# Patient Record
Sex: Female | Born: 1975
Health system: Southern US, Community
[De-identification: ages and names within clinical notes are randomized; demographics above are authoritative.]

## PROBLEM LIST (undated history)

## (undated) DIAGNOSIS — J45909 Unspecified asthma, uncomplicated: Secondary | ICD-10-CM

## (undated) HISTORY — PX: NO PAST SURGERIES: SHX2092

## (undated) HISTORY — DX: Unspecified asthma, uncomplicated: J45.909

---

## 2004-03-25 ENCOUNTER — Emergency Department (HOSPITAL_COMMUNITY): Admission: EM | Admit: 2004-03-25 | Discharge: 2004-03-25 | Payer: Self-pay | Admitting: Emergency Medicine

## 2004-04-06 ENCOUNTER — Ambulatory Visit: Payer: Self-pay | Admitting: Family Medicine

## 2004-05-26 ENCOUNTER — Ambulatory Visit: Payer: Self-pay | Admitting: Family Medicine

## 2004-07-29 ENCOUNTER — Ambulatory Visit: Payer: Self-pay | Admitting: Family Medicine

## 2004-09-07 ENCOUNTER — Ambulatory Visit: Payer: Self-pay | Admitting: *Deleted

## 2004-09-07 ENCOUNTER — Ambulatory Visit: Payer: Self-pay | Admitting: Family Medicine

## 2004-09-21 ENCOUNTER — Ambulatory Visit: Payer: Self-pay | Admitting: Family Medicine

## 2004-10-19 ENCOUNTER — Ambulatory Visit: Payer: Self-pay | Admitting: Family Medicine

## 2005-03-02 ENCOUNTER — Ambulatory Visit: Payer: Self-pay | Admitting: Nurse Practitioner

## 2005-04-06 ENCOUNTER — Ambulatory Visit: Payer: Self-pay | Admitting: Family Medicine

## 2005-07-05 ENCOUNTER — Ambulatory Visit: Payer: Self-pay | Admitting: Family Medicine

## 2006-04-26 ENCOUNTER — Ambulatory Visit: Payer: Self-pay | Admitting: Family Medicine

## 2006-07-26 ENCOUNTER — Encounter (INDEPENDENT_AMBULATORY_CARE_PROVIDER_SITE_OTHER): Payer: Self-pay | Admitting: Family Medicine

## 2006-07-26 ENCOUNTER — Ambulatory Visit: Payer: Self-pay | Admitting: Family Medicine

## 2007-01-17 ENCOUNTER — Ambulatory Visit: Payer: Self-pay | Admitting: Internal Medicine

## 2007-04-03 ENCOUNTER — Encounter (INDEPENDENT_AMBULATORY_CARE_PROVIDER_SITE_OTHER): Payer: Self-pay | Admitting: *Deleted

## 2007-06-06 ENCOUNTER — Ambulatory Visit: Payer: Self-pay | Admitting: Internal Medicine

## 2007-08-14 ENCOUNTER — Ambulatory Visit: Payer: Self-pay | Admitting: Family Medicine

## 2007-08-14 ENCOUNTER — Encounter: Payer: Self-pay | Admitting: Family Medicine

## 2007-08-14 ENCOUNTER — Encounter (INDEPENDENT_AMBULATORY_CARE_PROVIDER_SITE_OTHER): Payer: Self-pay | Admitting: Family Medicine

## 2007-08-14 LAB — CONVERTED CEMR LAB
Chlamydia, DNA Probe: NEGATIVE
GC Probe Amp, Genital: NEGATIVE

## 2007-08-22 ENCOUNTER — Ambulatory Visit: Payer: Self-pay | Admitting: Family Medicine

## 2007-08-22 ENCOUNTER — Encounter (INDEPENDENT_AMBULATORY_CARE_PROVIDER_SITE_OTHER): Payer: Self-pay | Admitting: Internal Medicine

## 2007-08-22 LAB — CONVERTED CEMR LAB
ALT: 17 units/L (ref 0–35)
Alkaline Phosphatase: 69 units/L (ref 39–117)
BUN: 12 mg/dL (ref 6–23)
Basophils Absolute: 0 10*3/uL (ref 0.0–0.1)
Calcium: 8.9 mg/dL (ref 8.4–10.5)
Creatinine, Ser: 0.69 mg/dL (ref 0.40–1.20)
HCT: 43.2 % (ref 36.0–46.0)
LDL Cholesterol: 90 mg/dL (ref 0–99)
Lymphs Abs: 2.7 10*3/uL (ref 0.7–4.0)
MCHC: 32.4 g/dL (ref 30.0–36.0)
MCV: 87.6 fL (ref 78.0–100.0)
Monocytes Relative: 7 % (ref 3–12)
Neutro Abs: 4.8 10*3/uL (ref 1.7–7.7)
Neutrophils Relative %: 58 % (ref 43–77)
Platelets: 277 10*3/uL (ref 150–400)
Potassium: 4.6 meq/L (ref 3.5–5.3)
RBC: 4.93 M/uL (ref 3.87–5.11)
RDW: 14.2 % (ref 11.5–15.5)
TSH: 0.666 microintl units/mL (ref 0.350–5.50)
Total Bilirubin: 0.6 mg/dL (ref 0.3–1.2)
Triglycerides: 103 mg/dL (ref <150)
VLDL: 21 mg/dL (ref 0–40)

## 2007-10-02 ENCOUNTER — Ambulatory Visit: Payer: Self-pay | Admitting: Family Medicine

## 2008-01-29 ENCOUNTER — Ambulatory Visit: Payer: Self-pay | Admitting: Internal Medicine

## 2008-05-06 ENCOUNTER — Ambulatory Visit: Payer: Self-pay | Admitting: Family Medicine

## 2009-03-24 ENCOUNTER — Ambulatory Visit: Payer: Self-pay | Admitting: Internal Medicine

## 2009-05-13 ENCOUNTER — Ambulatory Visit: Payer: Self-pay | Admitting: Internal Medicine

## 2010-02-16 ENCOUNTER — Ambulatory Visit: Payer: Self-pay | Admitting: Internal Medicine

## 2012-09-11 ENCOUNTER — Encounter (HOSPITAL_COMMUNITY): Payer: Self-pay

## 2012-09-11 ENCOUNTER — Emergency Department (HOSPITAL_COMMUNITY)
Admission: EM | Admit: 2012-09-11 | Discharge: 2012-09-11 | Disposition: A | Discharge status: Home / Self Care | Payer: No Typology Code available for payment source | Source: Home / Self Care

## 2012-09-11 MED ORDER — ALBUTEROL SULFATE HFA 108 (90 BASE) MCG/ACT IN AERS: 2.0000 | INHALATION_SPRAY | RESPIRATORY_TRACT | Status: DC | PRN

## 2012-09-11 MED ORDER — FLUTICASONE-SALMETEROL 250-50 MCG/DOSE IN AEPB: 1.0000 | INHALATION_SPRAY | Freq: Two times a day (BID) | RESPIRATORY_TRACT | Status: DC

## 2012-09-11 NOTE — ED Provider Notes (Signed)
Patient Demographics  Michele Wright, is a 37 y.o. female  ZOX:[id number]  [id number]  DOB - 09-19-1975  Chief Complaint  Patient presents with  . Asthma        Subjective:   Michele Wright (non-English speaking)Spanish speaking female , and I spoke to her through an interpreter by phone.today she has, No headache, No chest pain, No abdominal pain - No Nausea,  No Cough - SOB. She reports that she has had numbness in both hands that is worse after work. 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 She has a history of asthma and is requesting a refill of her medications also asking where she can get her medications at the cheapest cost.  Objective:    Filed Vitals:   09/11/12 1022  BP: 116/62  Pulse: 53  Temp: 98 F (36.7 C)  TempSrc: Oral  SpO2: 100%     Exam  Awake Alert, Oriented X 3, No new F.N deficits, Normal affect [place].AT,PERRAL Supple Neck,No JVD, No cervical lymphadenopathy appriciated.  Symmetrical Chest wall movement, Good air movement bilaterally, CTAB RRR,No Gallops,Rubs or new Murmurs, No Parasternal Heave +ve B.Sounds, Abd Soft, Non tender, No organomegaly appriciated, No rebound - guarding or rigidity. Extremities/neuro:No Cyanosis, Clubbing or edema, No new Rash or bruise. The strength in her hands is 5 out of 5, sensory is grossly intact, nonfocal.    Data Review   CBC No results found for this basename: WBC, HGB, HCT, PLT, MCV, MCH, MCHC, RDW, NEUTRABS, LYMPHSABS, MONOABS, EOSABS, BASOSABS, BANDABS, BANDSABD,  in the last 168 hours  Chemistries   No results found for this basename: NA, K, CL, CO2, GLUCOSE, BUN, CREATININE, GFRCGP, CALCIUM, MG, AST, ALT, ALKPHOS, BILITOT,  in the last 168 hours ------------------------------------------------------------------------------------------------------------------ No results found for this basename: HGBA1C,  in the last 72  hours ------------------------------------------------------------------------------------------------------------------ No results found for this basename: CHOL, HDL, LDLCALC, TRIG, CHOLHDL, LDLDIRECT,  in the last 72 hours ------------------------------------------------------------------------------------------------------------------ No results found for this basename: TSH, T4TOTAL, FREET3, T3FREE, THYROIDAB,  in the last 72 hours ------------------------------------------------------------------------------------------------------------------ No results found for this basename: VITAMINB12, FOLATE, FERRITIN, TIBC, IRON, RETICCTPCT,  in the last 72 hours  Coagulation profile  No results found for this basename: INR, PROTIME,  in the last 168 hours     Prior to Admission medications   Medication Sig Start Date End Date Taking? Authorizing Provider  Albuterol (PROVENTIL IN) Inhale into the lungs.   Yes Historical Provider, MD  Fluticasone-Salmeterol (ADVAIR) 250-50 MCG/DOSE AEPB Inhale 1 puff into the lungs every 12 (twelve) hours.   Yes Historical Provider, MD  albuterol (PROVENTIL HFA;VENTOLIN HFA) 108 (90 BASE) MCG/ACT inhaler Inhale 2 puffs into the lungs every 4 (four) hours as needed for wheezing. 09/11/12   Kela Millin, MD  Fluticasone-Salmeterol (ADVAIR DISKUS) 250-50 MCG/DOSE AEPB Inhale 1 puff into the lungs 2 (two) times daily. 09/11/12   Kela Millin, MD     Assessment & Plan   1.Asthma -Stable, Will refill her medications -Advair and when necessary albuterol. 2. Bilateral hand numbness-probable carpal tunnel syndrome -I have recommended bilateral wrist splints  Follow-up Information   Follow up In 3 months. (or as needed)        Aleksei Goodlin C M.D on 09/11/2012 at 10:57 AM   Kela Millin, MD 09/11/12 1105

## 2012-09-11 NOTE — ED Notes (Signed)
Patient has history of asthma Needs medication refill

## 2012-09-11 NOTE — Discharge Instructions (Signed)
Bilateral wrist splints at daily at bed time , and ibuprofen as needed only

## 2013-04-03 ENCOUNTER — Ambulatory Visit: Payer: No Typology Code available for payment source | Attending: Family Medicine | Admitting: Internal Medicine

## 2013-04-03 VITALS — BP 134/86 | HR 82 | Temp 98.2°F | Resp 16 | Wt 172.0 lb

## 2013-04-03 DIAGNOSIS — Z0000 Encounter for general adult medical examination without abnormal findings: Secondary | ICD-10-CM | POA: Insufficient documentation

## 2013-04-03 DIAGNOSIS — Z23 Encounter for immunization: Secondary | ICD-10-CM

## 2013-04-03 DIAGNOSIS — J45909 Unspecified asthma, uncomplicated: Secondary | ICD-10-CM | POA: Insufficient documentation

## 2013-04-03 LAB — CBC WITH DIFFERENTIAL/PLATELET
Eosinophils Relative: 3 % (ref 0–5)
Hemoglobin: 14.6 g/dL (ref 12.0–15.0)
Lymphocytes Relative: 32 % (ref 12–46)
MCV: 82.9 fL (ref 78.0–100.0)
Monocytes Absolute: 0.6 10*3/uL (ref 0.1–1.0)
Neutro Abs: 5.1 10*3/uL (ref 1.7–7.7)
RBC: 5.2 MIL/uL — ABNORMAL HIGH (ref 3.87–5.11)
RDW: 13.8 % (ref 11.5–15.5)

## 2013-04-03 LAB — LIPID PANEL
HDL: 52 mg/dL (ref >39)
LDL Cholesterol: 99 mg/dL (ref 0–99)
Triglycerides: 170 mg/dL — ABNORMAL HIGH (ref <150)

## 2013-04-03 LAB — BASIC METABOLIC PANEL
BUN: 17 mg/dL (ref 6–23)
CO2: 23 mEq/L (ref 19–32)
Creat: 0.64 mg/dL (ref 0.50–1.10)

## 2013-04-03 MED ORDER — FLUTICASONE-SALMETEROL 250-50 MCG/DOSE IN AEPB: 1.0000 | INHALATION_SPRAY | Freq: Two times a day (BID) | RESPIRATORY_TRACT | Status: DC

## 2013-04-03 MED ORDER — ALBUTEROL SULFATE HFA 108 (90 BASE) MCG/ACT IN AERS: 2.0000 | INHALATION_SPRAY | RESPIRATORY_TRACT | Status: DC | PRN

## 2013-04-03 NOTE — Progress Notes (Signed)
Patient here for medication refill Need advair and inhaler

## 2013-04-03 NOTE — Progress Notes (Signed)
Patient ID: Michele Wright, female   DOB: [birth year], 37 y.o.   MRN: [id number] Patient Demographics  Michele Wright, is a 37 y.o. female  CSN:[id number]  [id number]:[id number]  DOB - 1976/01/24  Chief Complaint  Patient presents with  . Medication Refill        Subjective:   Michele Wright today is here to establish primary care. Patient has No headache, No chest pain, No abdominal pain - No Nausea,  No Cough - SOB. Wants refill for her asthma medication.  Also states that she works in a [employer], at the end of the day her fingers of both hands feel numb and tingling  Objective:    Filed Vitals:   04/03/13 1032  BP: 134/86  Pulse: 82  Temp: 98.2 F (36.8 C)  Resp: 16  Weight: 172 lb (78.019 kg)  SpO2: 98%     ALLERGIES:  No Known Allergies  PAST MEDICAL HISTORY: History reviewed. No pertinent past medical history.  PAST SURGICAL HISTORY: History reviewed. No pertinent past surgical history.  FAMILY HISTORY: History reviewed. No pertinent family history.  MEDICATIONS AT HOME: Prior to Admission medications   Medication Sig Start Date End Date Taking? Authorizing Provider  albuterol (PROVENTIL HFA;VENTOLIN HFA) 108 (90 BASE) MCG/ACT inhaler Inhale 2 puffs into the lungs every 4 (four) hours as needed for wheezing. 04/03/13   Shaquia Berkley Jenna Luo, MD  Fluticasone-Salmeterol (ADVAIR DISKUS) 250-50 MCG/DOSE AEPB Inhale 1 puff into the lungs 2 (two) times daily. 09/11/12   Kela Millin, MD  Fluticasone-Salmeterol (ADVAIR) 250-50 MCG/DOSE AEPB Inhale 1 puff into the lungs 2 (two) times daily. 04/03/13   Molly Maselli Jenna Luo, MD    REVIEW OF SYSTEMS:  Constitutional:   No   Fevers, chills, fatigue.  HEENT:    No headaches, Sore throat,   Cardio-vascular: No chest pain,  Orthopnea, swelling in lower extremities, anasarca, palpitations  GI:  No abdominal pain, nausea, vomiting, diarrhea  Resp: No shortness of breath,  No coughing up of blood.No  cough.No wheezing.  Skin:  no rash or lesions.  GU:  no dysuria, change in color of urine, no urgency or frequency.  No flank pain.  Musculoskeletal: No joint pain or swelling.  No decreased range of motion.  No back pain.  Psych: No change in mood or affect. No depression or anxiety.  No memory loss.   Exam  General appearance :Awake, alert, NAD, Speech Clear. HEENT: Atraumatic and Normocephalic, PERLA Neck: supple, no JVD. No cervical lymphadenopathy.  Chest: clear to auscultation bilaterally, no wheezing, rales or rhonchi CVS: S1 S2 regular, no murmurs.  Abdomen: soft, NBS, NT, ND, no gaurding, rigidity or rebound. Extremities: No cyanosis, clubbing, B/L Lower Ext shows no edema,  Neurology: Awake alert, and oriented X 3, CN II-XII intact, Non focal Skin:No Rash or lesions Wounds: N/A    Data Review   Basic Metabolic Panel: No results found for this basename: NA, K, CL, CO2, GLUCOSE, BUN, CREATININE, CALCIUM, MG, PHOS,  in the last 168 hours Liver Function Tests: No results found for this basename: AST, ALT, ALKPHOS, BILITOT, PROT, ALBUMIN,  in the last 168 hours  CBC: No results found for this basename: WBC, NEUTROABS, HGB, HCT, MCV, PLT,  in the last 168 hours ------------------------------------------------------------------------------------------------------------------ No results found for this basename: HGBA1C,  in the last 72 hours ------------------------------------------------------------------------------------------------------------------ No results found for this basename: CHOL, HDL, LDLCALC, TRIG, CHOLHDL, LDLDIRECT,  in the last 72 hours ------------------------------------------------------------------------------------------------------------------ No results found for this basename: TSH,  T4TOTAL, FREET3, T3FREE, THYROIDAB,  in the last 72  hours ------------------------------------------------------------------------------------------------------------------ No results found for this basename: VITAMINB12, FOLATE, FERRITIN, TIBC, IRON, RETICCTPCT,  in the last 72 hours  Coagulation profile  No results found for this basename: INR, PROTIME,  in the last 168 hours    Assessment & Plan   Active Problems: Chronic asthma: - Currently stable, no wheezing. 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 Denies any pain. Difficult to change job assignment. - If no significant improvement in couple of months, may need neurosurgery referral  General health screening - Patient states she gets routine Pap smears every year and she takes care of it by herself with OB/GYN as she has an IUD - Will order flu vaccine - Ordered CBC, BMET, lipid panel  Follow-up in one month for the labs and to assess improvement in the numbness and tingling in fingers   Kahli Mayon M.D. 04/03/2013, 10:48 AM

## 2013-05-05 ENCOUNTER — Ambulatory Visit: Payer: No Typology Code available for payment source

## 2013-05-08 ENCOUNTER — Ambulatory Visit: Payer: No Typology Code available for payment source | Attending: Internal Medicine | Admitting: Internal Medicine

## 2013-05-08 VITALS — BP 124/78 | HR 76 | Temp 98.2°F | Resp 17 | Wt 171.2 lb

## 2013-05-08 DIAGNOSIS — Z09 Encounter for follow-up examination after completed treatment for conditions other than malignant neoplasm: Secondary | ICD-10-CM

## 2013-05-08 DIAGNOSIS — J45909 Unspecified asthma, uncomplicated: Secondary | ICD-10-CM | POA: Insufficient documentation

## 2013-05-08 NOTE — Progress Notes (Signed)
Patient here with interpreter States here for follow up on her asthma and for blood work

## 2013-05-08 NOTE — Patient Instructions (Signed)
Síndrome del túnel carpiano   (Carpal Tunnel Syndrome)   El túnel carpiano es un espacio estrecho ubicado en el lado palmar de la muñeca. Está formado por los huesos de la muñeca y los ligamentos. Los nervios, vasos sanguíneos y tendones pasan a través del túnel carpiano. Los movimientos de la muñeca o ciertas enfermedades pueden causar hinchazón del túnel. Esta hinchazón comprime el nervio principal en la muñeca ((nervio mediano) y ocasiona un trastorno doloroso que se denomina síndrome del túnel carpiano.  CAUSAS   · Movimientos repetidos de la muñeca.  · Lesiones en la muñeca.  · Ciertas enfermedades como la artritis, la diabetes, el alcoholismo, el hipertiroidismo o la insuficiencia renal.  · Obesidad.  · Embarazo.  SÍNTOMAS   · Sensación de "pinchazos" en los dedos o la mano.  · Hormigueo o entumecimiento en los dedos o en la mano.  · Sensación dolorosa en todo el brazo.  · Dolor en la muñeca que sube por el brazo hasta el hombro.  · Dolor que baja por la mano o los dedos.  · Sensación de debilidad en las manos.  DIAGNÓSTICO   El médico le hará una historia clínica y un examen físico. Puede ser necesario hacer un electromiograma. Esta prueba mide las señales eléctricas enviadas por los músculos. Generalmente el paso de las señales eléctricas es impedido por el síndrome del túnel carpiano. También puede ser necesario que le tomen radiografías.   TRATAMIENTO   El síndrome del túnel carpiano puede curarse espontáneamente sin tratamiento. El médico le indicará el uso de un cabestrillo para la muñeca o que tome medicamentos como antiinflamatorios no esteroides. Las inyecciones de cortisona pueden ayudar. A veces es necesaria la cirugía para liberar el nervio comprimido.   INSTRUCCIONES PARA EL CUIDADO EN EL HOGAR   · Tome todos los medicamentos según le indicó su médico. Sólo tome medicamentos de venta libre o recetados para calmar el dolor, las molestias o bajar la fiebre según las indicaciones de su médico.  · Si  le aconsejaron usar un cabestrillo para evitar que la muñeca se doble, úselo como le indicaron. Es importante que use el cabestrillo durante la noche. Úselo mientras sienta dolor o adormecimiento en la mano, el brazo o la muñeca. Esto puede durar entre 1 y 2 meses.  · Haga reposar la muñeca de toda actividad que le cause dolor. Si sus síntomas están relacionados con el trabajo, deberá conversar con su empleador acerca de la posibilidad de cambiar a una tarea que no requiera el uso de la muñeca.  · Aplique hielo en la muñeca después de los períodos prolongados de actividad.  · Ponga el hielo en una bolsa plástica.  · Colóquese una toalla entre la piel y la bolsa de hielo.  · Deje el hielo en el lugar durante 15 a 20 minutos, 3 a 4 veces por día.  · Cumpla con todas las visitas de control, según le indique su médico. Aquí se incluyen derivaciones a un ortopedista, fisioterapia y rehabilitación. Toda demora en obtener la asistencia necesaria puede dar como resultado una demora o fracaso en la curación.  SOLICITE ATENCIÓN MÉDICA DE INMEDIATO SI:   · Desarrolla nuevos e inexplicables síntomas.  · Los síntomas actuales empeoran y la medicación no los controla.  ASEGÚRESE DE QUE:   · Comprende estas instrucciones.  · Controlará su enfermedad.  · Solicitará ayuda de inmediato si no mejora o si empeora.  Document Released: 07/03/2005 Document Revised: 03/27/2012  ExitCare® Patient Information ©2014 [employer]   LL[employer].

## 2013-05-08 NOTE — Progress Notes (Signed)
Patient ID: Michele Wright, female   DOB: 1976/04/09, 37 y.o.   MRN: [id number]   CC:  Followup  HPI:  Patient is 37 year old female who presents to clinic for followup on blood tests. She reports feeling well, denies any specific concerns such as chest pain or shortness of breath. She has had recent asthma flareup but explained she's been taking her Advair and albuterol as needed and her symptoms are now controlled well. She denies fevers and chills, no cough, no sick contacts or exposures. She does not need refills on medicines.  No Known Allergies Past Medical History  Diagnosis Date  . Asthma    Current Outpatient Prescriptions on File Prior to Visit  Medication Sig Dispense Refill  . albuterol (PROVENTIL HFA;VENTOLIN HFA) 108 (90 BASE) MCG/ACT inhaler Inhale 2 puffs into the lungs every 4 (four) hours as needed for wheezing.  1 Inhaler  5  . Fluticasone-Salmeterol (ADVAIR DISKUS) 250-50 MCG/DOSE AEPB Inhale 1 puff into the lungs 2 (two) times daily.  60 each  5  . Fluticasone-Salmeterol (ADVAIR) 250-50 MCG/DOSE AEPB Inhale 1 puff into the lungs 2 (two) times daily.  60 each  5   No current facility-administered medications on file prior to visit.   No known family medical history History   Social History  . Marital Status: Single    Spouse Name: N/A    Number of Children: N/A  . Years of Education: N/A   Occupational History  . Not on file.   Social History Main Topics  . Smoking status: Never Smoker   . Smokeless tobacco: Not on file  . Alcohol Use: Yes     Comment: occasionally  . Drug Use: No  . Sexual Activity: Not on file   Other Topics Concern  . Not on file   Social History Narrative  . No narrative on file    Review of Systems  Constitutional: Negative for fever, chills, diaphoresis, activity change, appetite change and fatigue.  HENT: Negative for ear pain, nosebleeds, congestion, facial swelling, rhinorrhea, neck pain, neck stiffness and ear  discharge.   Eyes: Negative for pain, discharge, redness, itching and visual disturbance.  Respiratory: Negative for cough, choking, chest tightness, shortness of breath, wheezing and stridor.   Cardiovascular: Negative for chest pain, palpitations and leg swelling.  Gastrointestinal: Negative for abdominal distention.  Genitourinary: Negative for dysuria, urgency, frequency, hematuria, flank pain, decreased urine volume, difficulty urinating and dyspareunia.  Musculoskeletal: Negative for back pain, joint swelling, arthralgias and gait problem.  Neurological: Negative for dizziness, tremors, seizures, syncope, facial asymmetry, speech difficulty, weakness, light-headedness, numbness and headaches.  Hematological: Negative for adenopathy. Does not bruise/bleed easily.  Psychiatric/Behavioral: Negative for hallucinations, behavioral problems, confusion, dysphoric mood, decreased concentration and agitation.    Objective:   Filed Vitals:   05/08/13 1019  BP: 124/78  Pulse: 76  Temp: 98.2 F (36.8 C)  Resp: 17    Physical Exam  Constitutional: Appears well-developed and well-nourished. No distress.  CVS: RRR, S1/S2 +, no murmurs, no gallops, no carotid bruit.  Pulmonary: Effort and breath sounds normal, no stridor, rhonchi, wheezes, rales.  Abdominal: Soft. BS +,  no distension, tenderness, rebound or guarding.   Lab Results  Component Value Date   WBC 8.7 04/03/2013   HGB 14.6 04/03/2013   HCT 43.1 04/03/2013   MCV 82.9 04/03/2013   PLT 251 04/03/2013   Lab Results  Component Value Date   CREATININE 0.64 04/03/2013   BUN 17 04/03/2013   NA 135  04/03/2013   K 4.2 04/03/2013   CL 104 04/03/2013   CO2 23 04/03/2013    No results found for this basename: HGBA1C   Lipid Panel     Component Value Date/Time   CHOL 185 04/03/2013 1049   TRIG 170* 04/03/2013 1049   HDL 52 04/03/2013 1049   CHOLHDL 3.6 04/03/2013 1049   VLDL 34 04/03/2013 1049   LDLCALC 99 04/03/2013 1049        Assessment and plan:   Patient Active Problem List   Diagnosis Date Noted  . Asthma, chronic - stable, maintaining oxygen saturations at target range. No wheezing on exam noted today. I have advised avoiding possible triggers such as carpets, dogs or cats. Patient denies having carpets, no pets at home. I've also advised continuing compliance with current medicines.  04/03/2013

## 2013-06-27 ENCOUNTER — Other Ambulatory Visit: Payer: Self-pay | Admitting: Internal Medicine

## 2013-06-27 MED ORDER — ALBUTEROL SULFATE HFA 108 (90 BASE) MCG/ACT IN AERS: 2.0000 | INHALATION_SPRAY | RESPIRATORY_TRACT | Status: DC | PRN

## 2013-06-27 MED ORDER — FLUTICASONE-SALMETEROL 250-50 MCG/DOSE IN AEPB: 1.0000 | INHALATION_SPRAY | Freq: Two times a day (BID) | RESPIRATORY_TRACT | Status: DC

## 2013-09-17 ENCOUNTER — Other Ambulatory Visit (HOSPITAL_COMMUNITY): Payer: Self-pay | Admitting: Nurse Practitioner

## 2013-09-17 DIAGNOSIS — Z1231 Encounter for screening mammogram for malignant neoplasm of breast: Secondary | ICD-10-CM

## 2013-10-01 ENCOUNTER — Ambulatory Visit (HOSPITAL_COMMUNITY)
Admission: RE | Admit: 2013-10-01 | Discharge: 2013-10-01 | Disposition: A | Discharge status: Home / Self Care | Payer: No Typology Code available for payment source | Source: Ambulatory Visit | Attending: Nurse Practitioner | Admitting: Nurse Practitioner

## 2013-10-01 DIAGNOSIS — Z1231 Encounter for screening mammogram for malignant neoplasm of breast: Secondary | ICD-10-CM

## 2013-10-06 ENCOUNTER — Other Ambulatory Visit: Payer: Self-pay | Admitting: Nurse Practitioner

## 2013-10-06 DIAGNOSIS — R928 Other abnormal and inconclusive findings on diagnostic imaging of breast: Secondary | ICD-10-CM

## 2013-10-15 ENCOUNTER — Ambulatory Visit
Admission: RE | Admit: 2013-10-15 | Discharge: 2013-10-15 | Disposition: A | Discharge status: Home / Self Care | Payer: No Typology Code available for payment source | Source: Ambulatory Visit | Attending: Nurse Practitioner | Admitting: Nurse Practitioner

## 2013-10-15 ENCOUNTER — Other Ambulatory Visit: Payer: Self-pay | Admitting: Nurse Practitioner

## 2013-10-15 DIAGNOSIS — R928 Other abnormal and inconclusive findings on diagnostic imaging of breast: Secondary | ICD-10-CM

## 2013-10-15 DIAGNOSIS — N6320 Unspecified lump in the left breast, unspecified quadrant: Secondary | ICD-10-CM

## 2013-10-22 ENCOUNTER — Other Ambulatory Visit: Payer: Self-pay

## 2014-01-07 ENCOUNTER — Ambulatory Visit: Payer: Self-pay | Admitting: Internal Medicine

## 2014-01-14 ENCOUNTER — Encounter: Payer: Self-pay | Admitting: Internal Medicine

## 2014-01-14 ENCOUNTER — Ambulatory Visit: Payer: No Typology Code available for payment source | Attending: Internal Medicine | Admitting: Internal Medicine

## 2014-01-14 VITALS — BP 124/78 | HR 97 | Temp 98.7°F | Wt 177.0 lb

## 2014-01-14 DIAGNOSIS — M79609 Pain in unspecified limb: Secondary | ICD-10-CM | POA: Insufficient documentation

## 2014-01-14 DIAGNOSIS — J45909 Unspecified asthma, uncomplicated: Secondary | ICD-10-CM | POA: Insufficient documentation

## 2014-01-14 DIAGNOSIS — M79672 Pain in left foot: Secondary | ICD-10-CM

## 2014-01-14 DIAGNOSIS — M79671 Pain in right foot: Secondary | ICD-10-CM | POA: Insufficient documentation

## 2014-01-14 MED ORDER — IBUPROFEN 600 MG PO TABS: 600.0000 mg | ORAL_TABLET | Freq: Three times a day (TID) | ORAL | Status: DC | PRN

## 2014-01-14 NOTE — Progress Notes (Signed)
MRN: [id number] Name: Michele Wright  Sex: female Age: 38 y.o. DOB: Mar 16, 1976  Allergies: Review of patient's allergies indicates no known allergies.  Chief Complaint  Patient presents with  . Asthma    follow up  . Foot Pain    both/heel    HPI: Patient is 38 y.o. female who has history of asthma comes today for followup her, denies any chest and shortness of breath she is on Advair and uses albuterol when necessary, denies smoking cigarettes, she reported to have bilateral heel pain which is more worse when she wakes up in the morning as well as after sitting for a while and when she starts walking then she feels more pain, she denies any fall or trauma denies numbness weakness.  Past Medical History  Diagnosis Date  . Asthma     No past surgical history on file.    Medication List       This list is accurate as of: 01/14/14  3:20 PM.  Always use your most recent med list.               albuterol 108 (90 BASE) MCG/ACT inhaler  Commonly known as:  PROVENTIL HFA;VENTOLIN HFA  Inhale 2 puffs into the lungs every 4 (four) hours as needed for wheezing.     Fluticasone-Salmeterol 250-50 MCG/DOSE Aepb  Commonly known as:  ADVAIR  Inhale 1 puff into the lungs 2 (two) times daily.     ibuprofen 600 MG tablet  Commonly known as:  ADVIL,MOTRIN  Take 1 tablet (600 mg total) by mouth every 8 (eight) hours as needed.        Meds ordered this encounter  Medications  . ibuprofen (ADVIL,MOTRIN) 600 MG tablet    Sig: Take 1 tablet (600 mg total) by mouth every 8 (eight) hours as needed.    Dispense:  30 tablet    Refill:  1    Immunization History  Administered Date(s) Administered  . Influenza Split 04/03/2013    No family history on file.  History  Substance Use Topics  . Smoking status: Never Smoker   . Smokeless tobacco: Not on file  . Alcohol Use: Yes     Comment: occasionally    Review of Systems   As noted in HPI  Filed Vitals:   01/14/14  1505  BP: 124/78  Pulse: 97  Temp: 98.7 F (37.1 C)    Physical Exam  Physical Exam  Constitutional: No distress.  Eyes: EOM are normal. Pupils are equal, round, and reactive to light.  Cardiovascular: Normal rate and regular rhythm.   Pulmonary/Chest: Breath sounds normal. No respiratory distress. She has no wheezes. She has no rales.  Musculoskeletal: She exhibits no edema.  Bilateral feet Tenderness with pressure on heels, 2+ dorsalis pedis pulse good range of motion at the ankle    CBC    Component Value Date/Time   WBC 8.7 04/03/2013 1049   RBC 5.20* 04/03/2013 1049   HGB 14.6 04/03/2013 1049   HCT 43.1 04/03/2013 1049   PLT 251 04/03/2013 1049   MCV 82.9 04/03/2013 1049   LYMPHSABS 2.8 04/03/2013 1049   MONOABS 0.6 04/03/2013 1049   EOSABS 0.2 04/03/2013 1049   BASOSABS 0.0 04/03/2013 1049    CMP     Component Value Date/Time   NA 135 04/03/2013 1049   K 4.2 04/03/2013 1049   CL 104 04/03/2013 1049   CO2 23 04/03/2013 1049   GLUCOSE 82 04/03/2013 1049  BUN 17 04/03/2013 1049   CREATININE 0.64 04/03/2013 1049   CREATININE 0.69 08/22/2007 2120   CALCIUM 9.0 04/03/2013 1049   PROT 7.4 08/22/2007 2120   ALBUMIN 4.2 08/22/2007 2120   AST 16 08/22/2007 2120   ALT 17 08/22/2007 2120   ALKPHOS 69 08/22/2007 2120   BILITOT 0.6 08/22/2007 2120    Lab Results  Component Value Date/Time   CHOL 185 04/03/2013 10:49 AM    No components found with this basename: hga1c    Lab Results  Component Value Date/Time   AST 16 08/22/2007  9:20 PM    Assessment and Plan  Unspecified asthma(493.90) Symptoms are well controlled continue with Advair and albuterol when necessary.  Heel pain, bilateral -   Plan: Likely plantar fasciitis, I have advised patient to wear comfortable shoes, given the handout to read, prescribed ibuprofen (ADVIL,MOTRIN) 600 MG tablet when necessary for pain    Return in about 3 months (around 04/16/2014) for asthma.  Doris CheadleADVANI, Adalia Pettis, MD

## 2014-01-14 NOTE — Progress Notes (Signed)
Patient in for a follow up for asthma also complains of severe heel pain on both feet.gp

## 2014-01-14 NOTE — Patient Instructions (Signed)
Fascitis plantar (Plantar Fascitis) La fascitis plantar es un trastorno frecuente que ocasiona dolor en el pie. Se trata de una inflamacin (irritacin) de la banda de tejidos fibrosos y resistentes que se extienden desde el hueso del taln (calcneo) hasta la parte anterior de la planta del pie. Esta inflamacin puede deberse a que ha permanecido demasiado tiempo de pie, a zapatos que no calzan bien, a correr sobre superficies duras, al sobrepeso, a un andar anormal y el uso excesivo del pie con dolor (esto es frecuente en los corredores). Tambin es frecuente entre los que practican ejercicios aerbicos y entre los bailarines de ballet. SNTOMAS La persona que sufre fascitis plantar manifiesta:  Dolor intenso por la maana en la parte inferior del pie, especialmente al dar los primeros pasos luego de levantarse de la cama. Este dolor disminuye luego de caminar algunos minutos.  Dolor intenso al caminar luego de un tiempo prolongado de inactividad.  El dolor empeora al caminar descalzo o al subir escaleras. DIAGNSTICO  El mdico har el diagnstico examinando sus pies.  En general no es necesario indicar radiografas. PREVENCIN  Consulte a un profesional especializado en medicina del deporte antes de comenzar un nuevo programa de ejercicios.  Los programas de caminatas ofrecen un buen entrenamiento. Hay una menor probabilidad de sufrir lesiones por uso excesivo, que son frecuentes entre las personas que corren. Hay menos impacto y menos agresin a las articulaciones.  Comience lentamente todo nuevo programa de ejercicios. Si aparece algn problema o dolor, disminuya la cantidad de tiempo o la distancia hasta que se encuentre cmodo.  Use calzado de buena calidad y reemplcelo regularmente.  Estire el pie y los ligamentos que se encuentran en la parte posterior del tobillo (tendn de [place]) antes y despus de realizar actividad fsica.  Corra o practique ejercicios sobre superficies  parejas que no sean duras. Por ejemplo, el asfalto es mejor que el pavimento.  No corra descalzo sobre superficies duras.  Si camina sobre cinta, vare la inclinacin.  No siga con el entrenamiento si tiene problemas en el pie o en la articulacin. Busque ayuda profesional si no mejora. INSTRUCCIONES PARA EL CUIDADO DOMICILIARIO  Evite las actividades que le causan dolor hasta que se recupere.  Use hielo o compresas fras sobre las zonas doloridas despus de realizar ejercicios.  Only take over-the-counter or prescription medicines for pain, discomfort, or fever as directed by your caregiver.  Las plantillas blandas o las zapatillas con base de aire o gel pueden ser de gran ayuda.  Si los problemas continan o se agravan, consulte a un especialista en medicina del deporte o con su mdico personal. La cortisona es un potente antiinflamatorio que puede inyectarse en la zona dolorida. El profesional que lo asiste comentar este tratamiento con usted. EST SEGURO QUE:   Comprende las instrucciones para el alta mdica.  Controlar su enfermedad.  Solicitar atencin mdica de inmediato segn las indicaciones. Document Released: 04/12/2005 Document Revised: 09/25/2011 Exit[employer] Patient Information 2015 [employer]. This information is not intended to replace advice given to you by your health care provider. Make sure you discuss any questions you have with your health care provider.  

## 2014-01-15 ENCOUNTER — Ambulatory Visit: Payer: No Typology Code available for payment source | Attending: Internal Medicine

## 2014-01-20 ENCOUNTER — Other Ambulatory Visit: Payer: Self-pay

## 2014-01-20 MED ORDER — FLUTICASONE-SALMETEROL 250-50 MCG/DOSE IN AEPB: 1.0000 | INHALATION_SPRAY | Freq: Two times a day (BID) | RESPIRATORY_TRACT | Status: DC

## 2014-01-20 MED ORDER — ALBUTEROL SULFATE HFA 108 (90 BASE) MCG/ACT IN AERS: 2.0000 | INHALATION_SPRAY | RESPIRATORY_TRACT | Status: DC | PRN

## 2014-02-27 ENCOUNTER — Ambulatory Visit: Payer: No Typology Code available for payment source | Attending: Internal Medicine

## 2014-03-30 ENCOUNTER — Ambulatory Visit: Payer: No Typology Code available for payment source

## 2014-04-08 ENCOUNTER — Ambulatory Visit: Payer: No Typology Code available for payment source

## 2014-04-16 ENCOUNTER — Ambulatory Visit
Admission: RE | Admit: 2014-04-16 | Discharge: 2014-04-16 | Disposition: A | Discharge status: Home / Self Care | Payer: No Typology Code available for payment source | Source: Ambulatory Visit | Attending: Nurse Practitioner | Admitting: Nurse Practitioner

## 2014-04-16 ENCOUNTER — Ambulatory Visit: Payer: Self-pay | Admitting: Internal Medicine

## 2014-04-16 ENCOUNTER — Other Ambulatory Visit: Payer: Self-pay

## 2014-04-16 DIAGNOSIS — N6320 Unspecified lump in the left breast, unspecified quadrant: Secondary | ICD-10-CM

## 2014-04-22 ENCOUNTER — Encounter: Payer: Self-pay | Admitting: Internal Medicine

## 2014-04-22 ENCOUNTER — Ambulatory Visit: Payer: No Typology Code available for payment source | Attending: Internal Medicine | Admitting: Internal Medicine

## 2014-04-22 VITALS — BP 120/73 | HR 65 | Temp 97.7°F | Resp 18 | Ht 64.0 in | Wt 178.0 lb

## 2014-04-22 DIAGNOSIS — Z13820 Encounter for screening for osteoporosis: Secondary | ICD-10-CM | POA: Insufficient documentation

## 2014-04-22 DIAGNOSIS — J45909 Unspecified asthma, uncomplicated: Secondary | ICD-10-CM | POA: Insufficient documentation

## 2014-04-22 DIAGNOSIS — M79671 Pain in right foot: Secondary | ICD-10-CM

## 2014-04-22 DIAGNOSIS — M79672 Pain in left foot: Secondary | ICD-10-CM

## 2014-04-22 DIAGNOSIS — J4520 Mild intermittent asthma, uncomplicated: Secondary | ICD-10-CM

## 2014-04-22 DIAGNOSIS — Z139 Encounter for screening, unspecified: Secondary | ICD-10-CM

## 2014-04-22 DIAGNOSIS — Z23 Encounter for immunization: Secondary | ICD-10-CM

## 2014-04-22 MED ORDER — TRAMADOL HCL 50 MG PO TABS: 50.0000 mg | ORAL_TABLET | Freq: Three times a day (TID) | ORAL | Status: DC | PRN

## 2014-04-22 NOTE — Patient Instructions (Signed)
Fascitis plantar (Plantar Fascitis) La fascitis plantar es un trastorno frecuente que ocasiona dolor en el pie. Se trata de una inflamacin (irritacin) de la banda de tejidos fibrosos y resistentes que se extienden desde el hueso del taln (calcneo) hasta la parte anterior de la planta del pie. Esta inflamacin puede deberse a que ha permanecido demasiado tiempo de pie, a zapatos que no calzan bien, a correr sobre superficies duras, al sobrepeso, a un andar anormal y el uso excesivo del pie con dolor (esto es frecuente en los corredores). Tambin es frecuente entre los que practican ejercicios aerbicos y entre los bailarines de ballet. SNTOMAS La persona que sufre fascitis plantar manifiesta:  Dolor intenso por la maana en la parte inferior del pie, especialmente al dar los primeros pasos luego de levantarse de la cama. Este dolor disminuye luego de caminar algunos minutos.  Dolor intenso al caminar luego de un tiempo prolongado de inactividad.  El dolor empeora al caminar descalzo o al subir escaleras. DIAGNSTICO  El mdico har el diagnstico examinando sus pies.  En general no es necesario indicar radiografas. PREVENCIN  Consulte a un profesional especializado en medicina del deporte antes de comenzar un nuevo programa de ejercicios.  Los programas de caminatas ofrecen un buen entrenamiento. Hay una menor probabilidad de sufrir lesiones por uso excesivo, que son frecuentes entre las personas que corren. Hay menos impacto y menos agresin a las articulaciones.  Comience lentamente todo nuevo programa de ejercicios. Si aparece algn problema o dolor, disminuya la cantidad de tiempo o la distancia hasta que se encuentre cmodo.  Use calzado de buena calidad y reemplcelo regularmente.  Estire el pie y los ligamentos que se encuentran en la parte posterior del tobillo (tendn de [place]) antes y despus de realizar actividad fsica.  Corra o practique ejercicios sobre superficies  parejas que no sean duras. Por ejemplo, el asfalto es mejor que el pavimento.  No corra descalzo sobre superficies duras.  Si camina sobre cinta, vare la inclinacin.  No siga con el entrenamiento si tiene problemas en el pie o en la articulacin. Busque ayuda profesional si no mejora. INSTRUCCIONES PARA EL CUIDADO DOMICILIARIO  Evite las actividades que le causan dolor hasta que se recupere.  Use hielo o compresas fras sobre las zonas doloridas despus de realizar ejercicios.  Only take over-the-counter or prescription medicines for pain, discomfort, or fever as directed by your caregiver.  Las plantillas blandas o las zapatillas con base de aire o gel pueden ser de gran ayuda.  Si los problemas continan o se agravan, consulte a un especialista en medicina del deporte o con su mdico personal. La cortisona es un potente antiinflamatorio que puede inyectarse en la zona dolorida. El profesional que lo asiste comentar este tratamiento con usted. EST SEGURO QUE:   Comprende las instrucciones para el alta mdica.  Controlar su enfermedad.  Solicitar atencin mdica de inmediato segn las indicaciones. Document Released: 04/12/2005 Document Revised: 09/25/2011 Exit[employer] Patient Information 2015 [employer]. This information is not intended to replace advice given to you by your health care provider. Make sure you discuss any questions you have with your health care provider.  

## 2014-04-22 NOTE — Progress Notes (Signed)
Patient here for follow up Patient complains of severe heel and foot pain of both feet Patient complains of ankle swelling and itching on the bottom of her foot Patient complains of severe heel pain when getting out of bed and states, "I can hardly walk in the mornings". Patient describes the pain as a throbbing pinching sensation Patient states that there was an abnormal finding in her breast exam she had recently

## 2014-04-22 NOTE — Progress Notes (Signed)
MRN: [id number] Name: Michele Wright  Sex: female Age: 38 y.o. DOB: 1975-10-20  Allergies: Review of patient's allergies indicates no known allergies.  Chief Complaint  Patient presents with  . Follow-up    HPI: Patient is 38 y.o. female who has history of asthma currently on Advair and albuterol comes today for followup a still complaining of bilateral heel pain which is worse in the morning, she was given information about plantar fasciitis on the last visit as well as ibuprofen which as per patient she is taking but has not tried the exercises , patient also wants to have a blood work done.  Past Medical History  Diagnosis Date  . Asthma     History reviewed. No pertinent past surgical history.    Medication List       This list is accurate as of: 04/22/14 10:52 AM.  Always use your most recent med list.               albuterol 108 (90 BASE) MCG/ACT inhaler  Commonly known as:  PROVENTIL HFA;VENTOLIN HFA  Inhale 2 puffs into the lungs every 4 (four) hours as needed for wheezing.     Fluticasone-Salmeterol 250-50 MCG/DOSE Aepb  Commonly known as:  ADVAIR  Inhale 1 puff into the lungs 2 (two) times daily.     ibuprofen 600 MG tablet  Commonly known as:  ADVIL,MOTRIN  Take 1 tablet (600 mg total) by mouth every 8 (eight) hours as needed.     traMADol 50 MG tablet  Commonly known as:  ULTRAM  Take 1 tablet (50 mg total) by mouth every 8 (eight) hours as needed for moderate pain.        Meds ordered this encounter  Medications  . traMADol (ULTRAM) 50 MG tablet    Sig: Take 1 tablet (50 mg total) by mouth every 8 (eight) hours as needed for moderate pain.    Dispense:  30 tablet    Refill:  0    Immunization History  Administered Date(s) Administered  . Influenza Split 04/03/2013    History reviewed. No pertinent family history.  History  Substance Use Topics  . Smoking status: Never Smoker   . Smokeless tobacco: Not on file  . Alcohol Use:  Yes     Comment: occasionally    Review of Systems   As noted in HPI  Filed Vitals:   04/22/14 1018  BP: 120/73  Pulse: 65  Temp: 97.7 F (36.5 C)  Resp: 18    Physical Exam  Physical Exam  Constitutional: No distress.  Eyes: EOM are normal. Pupils are equal, round, and reactive to light.  Cardiovascular: Normal rate and regular rhythm.   Pulmonary/Chest: Breath sounds normal. No respiratory distress. She has no wheezes. She has no rales.  Musculoskeletal:  Bilateral feet Tenderness with pressure on heels, 2+ dorsalis pedis pulse good range of motion at the ankle     CBC    Component Value Date/Time   WBC 8.7 04/03/2013 1049   RBC 5.20* 04/03/2013 1049   HGB 14.6 04/03/2013 1049   HCT 43.1 04/03/2013 1049   PLT 251 04/03/2013 1049   MCV 82.9 04/03/2013 1049   LYMPHSABS 2.8 04/03/2013 1049   MONOABS 0.6 04/03/2013 1049   EOSABS 0.2 04/03/2013 1049   BASOSABS 0.0 04/03/2013 1049    CMP     Component Value Date/Time   NA 135 04/03/2013 1049   K 4.2 04/03/2013 1049   CL 104 04/03/2013 1049  CO2 23 04/03/2013 1049   GLUCOSE 82 04/03/2013 1049   BUN 17 04/03/2013 1049   CREATININE 0.64 04/03/2013 1049   CREATININE 0.69 08/22/2007 2120   CALCIUM 9.0 04/03/2013 1049   PROT 7.4 08/22/2007 2120   ALBUMIN 4.2 08/22/2007 2120   AST 16 08/22/2007 2120   ALT 17 08/22/2007 2120   ALKPHOS 69 08/22/2007 2120   BILITOT 0.6 08/22/2007 2120    Lab Results  Component Value Date/Time   CHOL 185 04/03/2013 10:49 AM    No components found with this basename: [id number]    Lab Results  Component Value Date/Time   AST 16 08/22/2007  9:20 PM    Assessment and Plan  Asthma, chronic, mild intermittent, uncomplicated Symptoms are stable continue with Advair and albuterol when necessary.  Heel pain, bilateral - Plan: Ambulatory referral to Podiatry, traMADol (ULTRAM) 50 MG tablet  Screening - Plan: Lipid panel, TSH, Vit D  25 hydroxy (rtn osteoporosis monitoring), CBC with Differential, COMPLETE  METABOLIC PANEL WITH GFR, Vitamin B12   Health Maintenance   -Influenza shot today   Return in about 3 months (around 07/23/2014) for asthma.  Doris CheadleADVANI, Tania Steinhauser, MD

## 2014-04-23 ENCOUNTER — Ambulatory Visit: Payer: Self-pay | Attending: Internal Medicine

## 2014-04-23 DIAGNOSIS — Z139 Encounter for screening, unspecified: Secondary | ICD-10-CM

## 2014-04-23 LAB — COMPLETE METABOLIC PANEL WITH GFR
ALBUMIN: 4 g/dL (ref 3.5–5.2)
ALT: 21 U/L (ref 0–35)
AST: 16 U/L (ref 0–37)
Alkaline Phosphatase: 67 U/L (ref 39–117)
BILIRUBIN TOTAL: 0.6 mg/dL (ref 0.2–1.2)
BUN: 12 mg/dL (ref 6–23)
CO2: 22 mEq/L (ref 19–32)
Calcium: 9.2 mg/dL (ref 8.4–10.5)
Chloride: 102 mEq/L (ref 96–112)
Creat: 0.62 mg/dL (ref 0.50–1.10)
GFR, Est African American: >89 mL/min
GLUCOSE: 88 mg/dL (ref 70–99)
POTASSIUM: 4.2 meq/L (ref 3.5–5.3)
Sodium: 135 mEq/L (ref 135–145)
Total Protein: 6.9 g/dL (ref 6.0–8.3)

## 2014-04-23 LAB — LIPID PANEL
CHOL/HDL RATIO: 3.4 ratio
CHOLESTEROL: 188 mg/dL (ref 0–200)
HDL: 55 mg/dL (ref >39)
LDL Cholesterol: 111 mg/dL — ABNORMAL HIGH (ref 0–99)
Triglycerides: 111 mg/dL (ref <150)
VLDL: 22 mg/dL (ref 0–40)

## 2014-04-23 LAB — CBC WITH DIFFERENTIAL/PLATELET
BASOS PCT: 0 % (ref 0–1)
Basophils Absolute: 0 10*3/uL (ref 0.0–0.1)
EOS ABS: 0.3 10*3/uL (ref 0.0–0.7)
Eosinophils Relative: 3 % (ref 0–5)
HEMATOCRIT: 40.5 % (ref 36.0–46.0)
Hemoglobin: 13.7 g/dL (ref 12.0–15.0)
LYMPHS ABS: 2.4 10*3/uL (ref 0.7–4.0)
Lymphocytes Relative: 28 % (ref 12–46)
MCH: 28.4 pg (ref 26.0–34.0)
MCHC: 33.8 g/dL (ref 30.0–36.0)
MCV: 84 fL (ref 78.0–100.0)
MONO ABS: 0.6 10*3/uL (ref 0.1–1.0)
MONOS PCT: 7 % (ref 3–12)
Neutro Abs: 5.3 10*3/uL (ref 1.7–7.7)
Neutrophils Relative %: 62 % (ref 43–77)
Platelets: 245 10*3/uL (ref 150–400)
RBC: 4.82 MIL/uL (ref 3.87–5.11)
RDW: 13.7 % (ref 11.5–15.5)
WBC: 8.6 10*3/uL (ref 4.0–10.5)

## 2014-04-23 LAB — VITAMIN B12: Vitamin B-12: 1584 pg/mL — ABNORMAL HIGH (ref 211–911)

## 2014-04-23 LAB — TSH: TSH: 1.381 u[IU]/mL (ref 0.350–4.500)

## 2014-04-24 LAB — VITAMIN D 25 HYDROXY (VIT D DEFICIENCY, FRACTURES): Vit D, 25-Hydroxy: 39 ng/mL (ref 30–89)

## 2014-05-07 ENCOUNTER — Encounter: Payer: Self-pay | Admitting: Podiatry

## 2014-05-07 ENCOUNTER — Ambulatory Visit: Payer: No Typology Code available for payment source

## 2014-05-07 ENCOUNTER — Ambulatory Visit: Payer: No Typology Code available for payment source | Admitting: Podiatry

## 2014-05-07 VITALS — BP 113/57 | HR 65 | Resp 16

## 2014-05-07 DIAGNOSIS — M722 Plantar fascial fibromatosis: Secondary | ICD-10-CM

## 2014-05-07 MED ORDER — METHYLPREDNISOLONE (PAK) 4 MG PO TABS: ORAL_TABLET | ORAL | Status: DC

## 2014-05-07 MED ORDER — MELOXICAM 15 MG PO TABS: 15.0000 mg | ORAL_TABLET | Freq: Every day | ORAL | Status: DC

## 2014-05-07 NOTE — Progress Notes (Signed)
   Subjective:    Patient ID: Michele Wright, female    DOB: [birth year], 38 y.o.   MRN: [id number]  HPI Comments: "Pain in feet"  Patient speaks spanish and points to bilateral heels saying they hurt. AM pain. She does indicate that the left is worse than the right.  Foot Pain      Review of Systems  Cardiovascular:       Calf pain with walking   Musculoskeletal: Positive for gait problem.  All other systems reviewed and are negative.      Objective:   Physical Exam: I have reviewed her past medical history medications allergies surgeries social history and review of systems. Pulses are strongly palpable bilateral. Neurologic sensorium is intact bilateral. Deep tendon reflexes are intact bilateral. Muscle strength is 5 over 5 dorsiflexors plantar flexors inverters everters all intrinsic musculature is intact. Orthopedic evaluation demonstrates all joints distal to the ankle a full range of motion without crepitus. She does have pain on palpation medial continued tubercles bilateral. She also has pes planus bilateral. No radiographs were taken today secondary to computer failure. Cutaneous evaluation demonstrates supple well hydrated cutis no erythema edema cellulitis drainage or odor.        Assessment & Plan:  Assessment: Plantar fasciitis bilateral pes planus bilateral.  Plan: Injected her bilateral heels today with Kenalog and a local anesthetic. The nurses performed a plantar fascial strapping bilateral. And I prescribed a Medrol Dosepak to be followed by meloxicam. We discussed appropriate shoe gear stretching exercises ice therapy shoe gear modifications. I will followup with her in one month.

## 2014-06-04 ENCOUNTER — Ambulatory Visit: Payer: No Typology Code available for payment source | Admitting: Podiatry

## 2014-06-25 ENCOUNTER — Ambulatory Visit: Payer: No Typology Code available for payment source | Admitting: Podiatry

## 2014-07-02 ENCOUNTER — Ambulatory Visit: Payer: No Typology Code available for payment source | Admitting: Podiatry

## 2014-07-02 DIAGNOSIS — M722 Plantar fascial fibromatosis: Secondary | ICD-10-CM

## 2014-07-02 MED ORDER — DICLOFENAC SODIUM 75 MG PO TBEC: 75.0000 mg | DELAYED_RELEASE_TABLET | Freq: Two times a day (BID) | ORAL | Status: DC

## 2014-07-04 NOTE — Progress Notes (Signed)
She presents today for follow-up of her plantar fasciitis. She states that she is approximately 50% improved. She continues all conservative therapies.  Objective: Vital signs are stable she is alert and oriented 3. Pulses are palpable bilateral. Neurologic sensorium is intact versus rusty monofilament. She has no calf pain. She has pain on palpation medial calcaneal tubercles bilateral.  Assessment: Plantar fasciitis bilateral.  Plan: Discussed etiology pathology conservative versus surgical therapies reinjected bilateral heels today started her on diclofenac and she will continue all other conservative therapies. She also received a plantar fascial strapping bilaterally today.

## 2014-07-30 ENCOUNTER — Ambulatory Visit: Payer: Self-pay | Admitting: Podiatry

## 2014-08-11 ENCOUNTER — Ambulatory Visit: Payer: Self-pay | Admitting: Podiatry

## 2014-08-20 ENCOUNTER — Ambulatory Visit: Payer: No Typology Code available for payment source | Admitting: Podiatry

## 2014-08-20 VITALS — BP 114/66 | HR 65 | Resp 12

## 2014-08-20 DIAGNOSIS — M722 Plantar fascial fibromatosis: Secondary | ICD-10-CM

## 2014-08-20 NOTE — Progress Notes (Signed)
She presents today for follow-up of her plantar fasciitis states that it has started to hurt again. They were doing great for a long time but recently has started bothering me while I'm at work.  Objective: Her signs are stable she is alert and oriented 3. Pulses are palpable bilateral. She is pain on palpation medial continued tubercles bilateral.  Assessment: Plantar fasciitis bilateral.  Plan: Injected the bilateral heels with Kenalog and local anesthetic and dispensed plantar fascial braces bilateral. Follow up with her in 1 month. She will also continue her anti-inflammatory.

## 2014-08-25 ENCOUNTER — Ambulatory Visit: Payer: No Typology Code available for payment source | Admitting: Podiatry

## 2014-09-17 ENCOUNTER — Other Ambulatory Visit: Payer: Self-pay | Admitting: Nurse Practitioner

## 2014-09-17 DIAGNOSIS — N6320 Unspecified lump in the left breast, unspecified quadrant: Secondary | ICD-10-CM

## 2014-09-22 ENCOUNTER — Ambulatory Visit: Payer: No Typology Code available for payment source | Admitting: Podiatry

## 2014-10-01 ENCOUNTER — Encounter: Payer: Self-pay | Admitting: Podiatry

## 2014-10-01 ENCOUNTER — Ambulatory Visit: Payer: No Typology Code available for payment source | Admitting: Podiatry

## 2014-10-01 VITALS — BP 123/72 | HR 72 | Resp 16

## 2014-10-01 DIAGNOSIS — M722 Plantar fascial fibromatosis: Secondary | ICD-10-CM

## 2014-10-01 NOTE — Progress Notes (Signed)
She presents today still complaining of plantar fasciitis of her left heel. She states that is considerably better but is still painful particularly with ambulation and she is trying to get back to work.  Objective: Vital signs are stable she is alert and oriented 3. Pulses are palpable. She still has pain on palpation medial calcaneal tubercle of her left heel.  Assessment: Pain and limp secondary to plantar fasciitis left.  Plan: Injected her left heel again today and she was scanned for set of orthotics.

## 2014-10-21 ENCOUNTER — Other Ambulatory Visit: Payer: Self-pay

## 2014-10-21 ENCOUNTER — Ambulatory Visit
Admission: RE | Admit: 2014-10-21 | Discharge: 2014-10-21 | Disposition: A | Discharge status: Home / Self Care | Payer: No Typology Code available for payment source | Source: Ambulatory Visit | Attending: Nurse Practitioner | Admitting: Nurse Practitioner

## 2014-10-21 DIAGNOSIS — N6320 Unspecified lump in the left breast, unspecified quadrant: Secondary | ICD-10-CM

## 2014-10-28 ENCOUNTER — Other Ambulatory Visit: Payer: No Typology Code available for payment source

## 2014-10-29 ENCOUNTER — Ambulatory Visit: Payer: Self-pay | Attending: Internal Medicine | Admitting: Internal Medicine

## 2014-10-29 ENCOUNTER — Encounter: Payer: Self-pay | Admitting: Internal Medicine

## 2014-10-29 ENCOUNTER — Ambulatory Visit: Payer: No Typology Code available for payment source | Admitting: *Deleted

## 2014-10-29 VITALS — BP 124/84 | HR 65 | Temp 98.0°F | Resp 16 | Wt 180.0 lb

## 2014-10-29 DIAGNOSIS — J4520 Mild intermittent asthma, uncomplicated: Secondary | ICD-10-CM | POA: Insufficient documentation

## 2014-10-29 DIAGNOSIS — M722 Plantar fascial fibromatosis: Secondary | ICD-10-CM

## 2014-10-29 DIAGNOSIS — Z7951 Long term (current) use of inhaled steroids: Secondary | ICD-10-CM | POA: Insufficient documentation

## 2014-10-29 DIAGNOSIS — K029 Dental caries, unspecified: Secondary | ICD-10-CM | POA: Insufficient documentation

## 2014-10-29 DIAGNOSIS — Z791 Long term (current) use of non-steroidal anti-inflammatories (NSAID): Secondary | ICD-10-CM | POA: Insufficient documentation

## 2014-10-29 MED ORDER — FLUTICASONE-SALMETEROL 250-50 MCG/DOSE IN AEPB: 1.0000 | INHALATION_SPRAY | Freq: Two times a day (BID) | RESPIRATORY_TRACT | Status: DC

## 2014-10-29 MED ORDER — ALBUTEROL SULFATE HFA 108 (90 BASE) MCG/ACT IN AERS: 2.0000 | INHALATION_SPRAY | RESPIRATORY_TRACT | Status: DC | PRN

## 2014-10-29 NOTE — Patient Instructions (Signed)

## 2014-10-29 NOTE — Progress Notes (Signed)
Patient here with interpreter- Michele Wright Patient states she is here for a referral to the dentist Has been having a problem with one of her front teeth and would to have It looked at

## 2014-10-29 NOTE — Progress Notes (Signed)
MRN: [id number] Name: Michele Wright  Sex: female Age: 39 y.o. DOB: 16-Nov-1975  Allergies: Review of patient's allergies indicates no known allergies.  Chief Complaint  Patient presents with  . Referral    HPI: Patient is 39 y.o. female who has history of asthma comes today for followup she is requesting refill on her medication Advair and albuterol, today her major concern is dental cavities and she is requesting referral to see a dentist, denies any fever chills sore throat chest pain shortness of breath.  Past Medical History  Diagnosis Date  . Asthma     History reviewed. No pertinent past surgical history.    Medication List       This list is accurate as of: 10/29/14 11:11 AM.  Always use your most recent med list.               albuterol 108 (90 BASE) MCG/ACT inhaler  Commonly known as:  PROVENTIL HFA;VENTOLIN HFA  Inhale 2 puffs into the lungs every 4 (four) hours as needed for wheezing.     diclofenac 75 MG EC tablet  Commonly known as:  VOLTAREN  Take 1 tablet (75 mg total) by mouth 2 (two) times daily.     Fluticasone-Salmeterol 250-50 MCG/DOSE Aepb  Commonly known as:  ADVAIR  Inhale 1 puff into the lungs 2 (two) times daily.     ibuprofen 600 MG tablet  Commonly known as:  ADVIL,MOTRIN  Take 1 tablet (600 mg total) by mouth every 8 (eight) hours as needed.     meloxicam 15 MG tablet  Commonly known as:  MOBIC  Take 1 tablet (15 mg total) by mouth daily.     traMADol 50 MG tablet  Commonly known as:  ULTRAM  Take 1 tablet (50 mg total) by mouth every 8 (eight) hours as needed for moderate pain.        Meds ordered this encounter  Medications  . Fluticasone-Salmeterol (ADVAIR) 250-50 MCG/DOSE AEPB    Sig: Inhale 1 puff into the lungs 2 (two) times daily.    Dispense:  3 each    Refill:  3  . albuterol (PROVENTIL HFA;VENTOLIN HFA) 108 (90 BASE) MCG/ACT inhaler    Sig: Inhale 2 puffs into the lungs every 4 (four) hours as needed for  wheezing.    Dispense:  3 Inhaler    Refill:  3    Immunization History  Administered Date(s) Administered  . Influenza Split 04/03/2013  . Influenza,inj,Quad PF,36+ Mos 04/22/2014    History reviewed. No pertinent family history.  History  Substance Use Topics  . Smoking status: Never Smoker   . Smokeless tobacco: Not on file  . Alcohol Use: Yes     Comment: occasionally    Review of Systems   As noted in HPI  Filed Vitals:   10/29/14 1008  BP: 124/84  Pulse: 65  Temp: 98 F (36.7 C)  Resp: 16    Physical Exam  Physical Exam  Constitutional: No distress.  HENT:  Dental cavities  Eyes: EOM are normal. Pupils are equal, round, and reactive to light.  Cardiovascular: Normal rate and regular rhythm.   Pulmonary/Chest: Breath sounds normal. No respiratory distress. She has no wheezes. She has no rales.  Musculoskeletal: She exhibits no edema.    CBC    Component Value Date/Time   WBC 8.6 04/23/2014 1013   RBC 4.82 04/23/2014 1013   HGB 13.7 04/23/2014 1013   HCT 40.5 04/23/2014 1013  PLT 245 04/23/2014 1013   MCV 84.0 04/23/2014 1013   LYMPHSABS 2.4 04/23/2014 1013   MONOABS 0.6 04/23/2014 1013   EOSABS 0.3 04/23/2014 1013   BASOSABS 0.0 04/23/2014 1013    CMP     Component Value Date/Time   NA 135 04/23/2014 1013   K 4.2 04/23/2014 1013   CL 102 04/23/2014 1013   CO2 22 04/23/2014 1013   GLUCOSE 88 04/23/2014 1013   BUN 12 04/23/2014 1013   CREATININE 0.62 04/23/2014 1013   CREATININE 0.69 08/22/2007 2120   CALCIUM 9.2 04/23/2014 1013   PROT 6.9 04/23/2014 1013   ALBUMIN 4.0 04/23/2014 1013   AST 16 04/23/2014 1013   ALT 21 04/23/2014 1013   ALKPHOS 67 04/23/2014 1013   BILITOT 0.6 04/23/2014 1013   GFRNONAA >89 04/23/2014 1013   GFRAA >89 04/23/2014 1013    Lab Results  Component Value Date/Time   CHOL 188 04/23/2014 10:13 AM    No results found for: HGBA1C  Lab Results  Component Value Date/Time   AST 16 04/23/2014 10:13 AM      Assessment and Plan  Dental cavities Referred to dentist  Asthma, chronic, mild intermittent, uncomplicated - Plan: Fluticasone-Salmeterol (ADVAIR) 250-50 MCG/DOSE AEPB, albuterol (PROVENTIL HFA;VENTOLIN HFA) 108 (90 BASE) MCG/ACT inhaler    Return in about 3 months (around 01/28/2015), or if symptoms worsen or fail to improve.   This note has been created with Education officer, [employer] speech recognition software and smart phrase technology. Any transcriptional errors are unintentional.    Doris CheadleADVANI, Vicy Medico, MD

## 2014-10-29 NOTE — Progress Notes (Signed)
Patient ID: Michele LeapMaria Yanez-Arroyo, female   DOB: 06/22/1976, 39 y.o.   MRN: [id number] PICKING UP INSERTS

## 2014-11-05 ENCOUNTER — Other Ambulatory Visit (HOSPITAL_COMMUNITY)
Admission: RE | Admit: 2014-11-05 | Discharge: 2014-11-05 | Disposition: A | Discharge status: Home / Self Care | Payer: No Typology Code available for payment source | Source: Ambulatory Visit | Attending: Internal Medicine | Admitting: Internal Medicine

## 2014-11-05 ENCOUNTER — Encounter: Payer: Self-pay | Admitting: Internal Medicine

## 2014-11-05 ENCOUNTER — Ambulatory Visit: Payer: Self-pay | Attending: Internal Medicine | Admitting: Internal Medicine

## 2014-11-05 VITALS — BP 124/81 | HR 58 | Temp 97.7°F | Resp 16 | Wt 181.0 lb

## 2014-11-05 DIAGNOSIS — Z113 Encounter for screening for infections with a predominantly sexual mode of transmission: Secondary | ICD-10-CM | POA: Insufficient documentation

## 2014-11-05 DIAGNOSIS — Z01419 Encounter for gynecological examination (general) (routine) without abnormal findings: Secondary | ICD-10-CM | POA: Insufficient documentation

## 2014-11-05 DIAGNOSIS — Z1151 Encounter for screening for human papillomavirus (HPV): Secondary | ICD-10-CM | POA: Insufficient documentation

## 2014-11-05 DIAGNOSIS — Z124 Encounter for screening for malignant neoplasm of cervix: Secondary | ICD-10-CM | POA: Insufficient documentation

## 2014-11-05 DIAGNOSIS — N760 Acute vaginitis: Secondary | ICD-10-CM | POA: Insufficient documentation

## 2014-11-05 NOTE — Progress Notes (Signed)
Patient ID: Michele Wright, female   DOB: 1976/07/16, 39 y.o.   MRN: [id number]  CC: pap only   HPI: Michele Wright is a 39 y.o. female here today for a pap smear. She reports that she has never had a history of abnormal paps. She denies vaginal discharge, itch, odor, lesion, or dysuria. She is up to date on her mammogram and reports that they found a benign mass in one of her breast.   Patient has No headache, No chest pain, No abdominal pain - No Nausea, No new weakness tingling or numbness, No Cough - SOB.  No Known Allergies Past Medical History  Diagnosis Date  . Asthma    Current Outpatient Prescriptions on File Prior to Visit  Medication Sig Dispense Refill  . albuterol (PROVENTIL HFA;VENTOLIN HFA) 108 (90 BASE) MCG/ACT inhaler Inhale 2 puffs into the lungs every 4 (four) hours as needed for wheezing. 3 Inhaler 3  . diclofenac (VOLTAREN) 75 MG EC tablet Take 1 tablet (75 mg total) by mouth 2 (two) times daily. 60 tablet 3  . Fluticasone-Salmeterol (ADVAIR) 250-50 MCG/DOSE AEPB Inhale 1 puff into the lungs 2 (two) times daily. 3 each 3  . ibuprofen (ADVIL,MOTRIN) 600 MG tablet Take 1 tablet (600 mg total) by mouth every 8 (eight) hours as needed. 30 tablet 1  . meloxicam (MOBIC) 15 MG tablet Take 1 tablet (15 mg total) by mouth daily. 30 tablet 1  . traMADol (ULTRAM) 50 MG tablet Take 1 tablet (50 mg total) by mouth every 8 (eight) hours as needed for moderate pain. 30 tablet 0   No current facility-administered medications on file prior to visit.   History reviewed. No pertinent family history. History   Social History  . Marital Status: Single    Spouse Name: N/A  . Number of Children: N/A  . Years of Education: N/A   Occupational History  . Not on file.   Social History Main Topics  . Smoking status: Never Smoker   . Smokeless tobacco: Not on file  . Alcohol Use: Yes     Comment: occasionally  . Drug Use: No  . Sexual Activity: Not on file   Other Topics  Concern  . Not on file   Social History Narrative    Review of Systems: See HPI   Objective:   Filed Vitals:   11/05/14 1042  BP: 124/81  Pulse: 58  Temp: 97.7 F (36.5 C)  Resp: 16    Physical Exam  Genitourinary: Rectum normal, vagina normal and uterus normal. Cervix exhibits no motion tenderness, no discharge and no friability. Right adnexum displays no tenderness. Left adnexum displays no tenderness.  Lymphadenopathy:       Right: No inguinal adenopathy present.       Left: No inguinal adenopathy present.     Lab Results  Component Value Date   WBC 8.6 04/23/2014   HGB 13.7 04/23/2014   HCT 40.5 04/23/2014   MCV 84.0 04/23/2014   PLT 245 04/23/2014   Lab Results  Component Value Date   CREATININE 0.62 04/23/2014   BUN 12 04/23/2014   NA 135 04/23/2014   K 4.2 04/23/2014   CL 102 04/23/2014   CO2 22 04/23/2014    No results found for: HGBA1C Lipid Panel     Component Value Date/Time   CHOL 188 04/23/2014 1013   TRIG 111 04/23/2014 1013   HDL 55 04/23/2014 1013   CHOLHDL 3.4 04/23/2014 1013   VLDL 22 04/23/2014 1013  LDLCALC 111* 04/23/2014 1013       Assessment and plan:   Michele Wright was seen today for gynecologic exam.  Diagnoses and all orders for this visit:  Papanicolaou smear Orders: -     Cytology - [facility] -     HIV antibody (with reflex) -     RPR  Return if symptoms worsen or fail to improve.       Holland CommonsKECK, Delson Dulworth, NP-C [facility] [phone] 11/05/2014, 11:32 AM

## 2014-11-05 NOTE — Patient Instructions (Signed)
Michele Wright (Health Maintenance) Adoptar un estilo de vida saludable y recibir atencin preventiva pueden ser de suma utilidad para promover la salud y Musician. Hable con el mdico para saber cul es el esquema de exmenes peridicos adecuado para usted. Esta es una buena oportunidad para Teacher, adult education peridicamente al mdico sobre cmo prevenir enfermedades y [place] sano. Entre cada control mdico, hay muchas cosas que puede hacer por s solo. Los expertos han investigado mucho acerca de los cambios en el estilo de vida y las medidas preventivas que muy probablemente preserven su salud. Consulte al mdico para obtener ms informacin. EL PESO Y LA DIETA  Consuma una dieta saludable.  Incluya abundante cantidad de verduras, frutas, productos lcteos descremados y protenas magras.  No coma muchos alimentos con alto contenido de grasas slidas, azcares agregados o sal.  Realice actividad fsica con regularidad. Esta es una de las cosas ms importantes que puede hacer por su salud.  La [employer] de las personas adultas deben hacer actividad fsica durante por lo menos 140mnutos semanales. El ejercicio debe aumentar la frecuencia cardaca y hNature conservation officersudar (ejercicio de intensidad moderada).  Adems, casi todos los adultos deben hacer ejercicios de fortalecimiento al mToysRusveces por semana como complemento del ejercicio de iDante Mantenga un peso saludable.  El ndice de masa corporal ([facility] es una medida que puede usarse para identificar posibles problemas relacionados con el peso. Este ofrece un clculo estimativo de la gAir traffic controlleren funcin del peso y lAgricultural consultant El mdico puede determinar su IEncino Outpatient Surgery Center LLCy ayudarlo a aScience writery mTheatre managerun peso saludable.  Para las mujeres mayores de 20aos:  Un ISurgicare Surgical Associates Of Mahwah LLCmenor de 18,5 se considera bajo peso.  Un IBaptist Health Medical Center - North Little Rockentre 18,5 y 24,9 es normal.  Un ITerrell State Hospitalentre 25 y 29,9 es sobrepeso.  Un IMC de 30 o ms se considera  obesidad. Controlar los niveles de colesterol y lpidos en la sangre  Debe comenzar a hacerse anlisis de sangre para controlar los nAscutneyde lpidos y colesterol a partir de lSed[place] y repetir estos estudios cada 5aos.  Tal vez deba someterse a controles de los niveles de colesterol con ms frecuencia si:  Tiene los niveles de lpidos o colesterol elevados.  Es mayor de 50aos.  Tiene un riesgo alto de tener enfermedades cardacas. DETECCIN DE CNCER  Cncer de pulmn  Se recomienda realizar exmenes de deteccin de cncer de pulmn a las personas adultas que tienen entre 581y 80aos, y corren riesgo de tBest boycncer de pulmn debido a sus antecedentes de tabaquismo.  Se recomienda realizar una tomografa computarizada anual de baja dosis de los pulmones a las personas que:  Siguen fumando.  Hayan dejado de fumar en los ltimos 15aos.  Hayan fumado un paquete diario durante 30aos. El ndice ao-paquete equivale a fumar, en promedio, un paquete de cigarrillos diario durante 1ao.  Debe seguir realizndose estudios de deteccin anual hasta que hayan pasado 15aos desde que dej de fumar.  Estos estudios deben suspenderse si tiene un problema de salud que le impedira recibir tratamiento para eScience writerde pulmn. Cncer de mama  Ponga en prctica la "autoconciencia de las mamas". Esto significa reconocer la apariencia normal de las mamas y cSt. [place]  Adems implica realizarse autoexmenes peridicos de lJohnson & Johnson Informe al mdico si hay algn cambio, sin importar cun pequeo sea.  Si tiene entre 20 y 30aos, un mdico debe hacerle un examen clnico de las mamas cada 1 a 316aoscomo parte del  examen habitual de salud.  Si es mayor de 40aos, debe Electrical engineer un examen clnico de las Microsoft. Tambin debe considerar la posibilidad de realizarse una radiografa de las mamas ([place]) todos los [place].  Si tiene antecedentes familiares de cncer de  mama, hable con el mdico para saber si debe someterse a un estudio gentico.  Si tiene un riesgo alto de Animal nutritionist de mama, hable con el mdico para saber si debe hacerse una resonancia magntica y [employer].  Se recomienda una evaluacin del gen del cncer de mama (BRCA) a las mujeres que tengan familiares con tumores malignos relacionados con el BRCA. Los tumores malignos relacionados con elBRCA incluyen:  [employer].  Los [employer].  Los tumores malignos del peritoneo.  Los resultados de la evaluacin determinarn la necesidad de asesoramiento gentico y de [place] de BRCA1 y BRCA2. Cncer de cuello uterino Ya no se recomiendan los exmenes plvicos de rutina para la deteccin del cncer de cuello uterino en las mujeres que no estn embarazadas que son consideradas sujetos de bajo riesgo de Best boy cncer de los rganos de la pelvis (ovarios, tero y vagina) y que no tienen sntomas. Tal vez sea necesario realizar un examen plvico si tiene sntomas, incluidos aquellos que estn asociados con infecciones en la pelvis. Pregntele al mdico si un examen plvico de deteccin es adecuado para usted.   El Papanicolau es la prueba de deteccin del cncer de cuello uterino para las mujeres que podran Engineer, production.  Si le han realizado una histerectoma por un problema que no era cncer u otra enfermedad que podra causar cncer, ya no necesitar realizarse pruebas de Papanicolaou.  Si es mayor de 39aos y los resultados de las pruebas de Papanicolaou han sido normales durante los ltimos 10aos, ya no es necesario que se realice estos estudios.  Si ha recibido un tratamiento para el cncer de cuello uterino o para una enfermedad que podra causar cncer, necesitar realizarse una prueba de Papanicolaou y controles durante al menos 26 aos de concluido el [place].  Si ya no se realiza pruebas de Papanicolaou, debe evaluar sus factores de riesgo si estos  se modifican (por ejemplo, tiene un nuevo compaero sexual). Esta situacin puede influir en la necesidad de que se someta nuevamente a estudios de deteccin.  Algunas mujeres sufren problemas mdicos que aumentan la probabilidad de tener cncer de cuello uterino. En este caso, el mdico podr indicarle que se someta a exmenes de deteccin y pruebas de Papanicolaou con ms frecuencia.  La prueba del virus del Engineer, technical sales (VPH) es un estudio adicional que puede usarse para la deteccin del cncer de cuello uterino. Esta prueba busca la presencia del virus que puede causar cambios celulares en el cuello del tero. Las clulas que se recolectan durante la prueba de Papanicolaou pueden usarse para el VPH.  La prueba del VPH puede usarse para examinar a las Cendant Corporation de 46TKP. Someterse a International aid/development worker del VPH puede prolongar el [employer] las pruebas de Papanicolaou normales de tres a Product manager.  Adems, se debe realizar la prueba del VPH para evaluar a las mujeres de cualquier edad cuyos resultados del Papanicolau no sean claros.  Despus de los 30aos, las mujeres deben realizarse pruebas del VPH con la misma frecuencia que las pruebas de Papanicolau. Cncer colorrectal  Es posible detectar este tipo de cncer y, a menudo, es posible prevenirlo.  Generalmente, los estudios de deteccin de  rutina del cncer colorrectal empiezan a hacerse a partir de los 50aos y continan hasta los 75aos.  El mdico puede recomendar que se los haga antes, si tiene factores de riesgo de cncer de colon.  Adems, el mdico puede recomendar que use un kit de prueba casera para hallar sangre oculta en la materia fecal.  Es posible que se use una pequea cmara en el extremo de un tubo para examinar directamente el colon (sigmoidoscopa o colonoscopa), con el fin de detectar las formas ms incipientes de cncer colorrectal.  Generalmente, los estudios de deteccin de rutina se realizan  a partir de los 50aos.  El examen directo del colon debe repetirse cada 5 a 10aos hasta cumplir 75aos. Sin embargo, tal vez deba someterse a la prueba de deteccin con ms frecuencia si se encuentran formas incipientes de plipos precancerosos o pequeos tumores. Cncer de piel  Revsese la piel peridicamente desde los dedos de los pies hasta la cabeza.  Informe al mdico si aparecen nuevos lunares o si nota cambios en los que ya tiene, especialmente en la forma o el color.  Tambin notifquele si tiene un lunar cuyo tamao es ms grande que el de la goma de un lpiz.  Use siempre pantalla solar. Aplique pantalla solar tantas veces como pueda a lo largo del da.  Protjase usando mangas y pantalones largos, un sombrero de ala ancha y gafas para el sol todo el ao, siempre que est al aire libre. ENFERMEDADES CARDACAS, DIABETES E HIPERTENSIN ARTERIAL   Debe controlarse la presin arterial al menos cada 1 o 2aos. La hipertensin arterial causa enfermedades cardacas y aumenta el riesgo de ictus.  Si tiene entre 55 y 79aos, consulte al mdico si debe tomar aspirina para prevenir ictus.  Hgase anlisis peridicos para la diabetes, que incluyen la toma de una muestra de sangre para controlar el nivel de azcar en la sangre mientras est en ayunas.  Si su peso es normal y tiene un riesgo bajo de tener diabetes, hgase este anlisis una vez cada tres aos, despus de los 45aos.  Si tiene sobrepeso y un riesgo alto de sufrir diabetes, considere la posibilidad de hacerse los anlisis a una edad ms temprana o con ms frecuencia. PREVENCIN DE INFECCIONES  Hepatitis B  Si tiene un riesgo ms alto de tener hepatitisB, debe hacerse anlisis de deteccin de este virus. Se considera que tiene un alto riesgo de hepatitis B si:  Naci en un pas donde la hepatitisB es frecuente. Pregntele al mdico qu pases son considerados de alto riesgo.  Sus padres nacieron en un pas de alto  riesgo, y usted no recibi la vacuna contra la hepatitis B.  Tiene VIH o sida.  Usa agujas para inyectarse drogas.  Convive con una persona que tiene hepatitisB.  Tuvo relaciones sexuales con una persona que tiene hepatitisB.  Recibe tratamiento de hemodilisis.  Toma ciertos medicamentos para cuadros clnicos tales como cncer, trasplante de  

## 2014-11-06 LAB — CERVICOVAGINAL ANCILLARY ONLY
Chlamydia: NEGATIVE
NEISSERIA GONORRHEA: NEGATIVE
WET PREP (BD AFFIRM): NEGATIVE

## 2014-11-06 LAB — RPR

## 2014-11-06 LAB — HIV ANTIBODY (ROUTINE TESTING W REFLEX): HIV 1&2 Ab, 4th Generation: NONREACTIVE

## 2014-11-09 LAB — CYTOLOGY - PAP

## 2014-11-13 ENCOUNTER — Telehealth: Payer: Self-pay | Admitting: *Deleted

## 2014-11-13 NOTE — Telephone Encounter (Signed)
Left vm w/ Belen for pt to return my call.

## 2014-11-13 NOTE — Telephone Encounter (Signed)
-----   Message from Ambrose FinlandValerie A Keck, NP sent at 11/09/2014 10:43 PM EDT ----- Patient pap is negative for malignancies and infections. Will repeat in 3 years.

## 2014-11-16 ENCOUNTER — Encounter: Payer: Self-pay | Admitting: *Deleted

## 2014-11-16 NOTE — Progress Notes (Signed)
Pt is aware of her lab and pap results. 

## 2015-03-31 ENCOUNTER — Other Ambulatory Visit: Payer: Self-pay | Admitting: Nurse Practitioner

## 2015-03-31 DIAGNOSIS — N6320 Unspecified lump in the left breast, unspecified quadrant: Secondary | ICD-10-CM

## 2015-04-02 ENCOUNTER — Other Ambulatory Visit: Payer: Self-pay | Admitting: *Deleted

## 2015-04-02 DIAGNOSIS — J4520 Mild intermittent asthma, uncomplicated: Secondary | ICD-10-CM

## 2015-04-02 MED ORDER — ALBUTEROL SULFATE HFA 108 (90 BASE) MCG/ACT IN AERS: 2.0000 | INHALATION_SPRAY | RESPIRATORY_TRACT | Status: DC | PRN

## 2015-04-02 MED ORDER — FLUTICASONE-SALMETEROL 250-50 MCG/DOSE IN AEPB: 1.0000 | INHALATION_SPRAY | Freq: Two times a day (BID) | RESPIRATORY_TRACT | Status: DC

## 2015-04-09 ENCOUNTER — Ambulatory Visit: Payer: No Typology Code available for payment source | Attending: Family Medicine

## 2015-04-21 ENCOUNTER — Other Ambulatory Visit: Payer: No Typology Code available for payment source

## 2015-04-28 ENCOUNTER — Ambulatory Visit
Admission: RE | Admit: 2015-04-28 | Discharge: 2015-04-28 | Disposition: A | Discharge status: Home / Self Care | Payer: No Typology Code available for payment source | Source: Ambulatory Visit | Attending: Nurse Practitioner | Admitting: Nurse Practitioner

## 2015-04-28 DIAGNOSIS — N6320 Unspecified lump in the left breast, unspecified quadrant: Secondary | ICD-10-CM

## 2015-06-02 ENCOUNTER — Ambulatory Visit: Payer: Self-pay | Attending: Family Medicine | Admitting: Pharmacist

## 2015-06-02 DIAGNOSIS — Z23 Encounter for immunization: Secondary | ICD-10-CM | POA: Insufficient documentation

## 2015-06-02 MED ORDER — INFLUENZA VAC SPLIT QUAD 0.5 ML IM SUSY
0.5000 mL | PREFILLED_SYRINGE | Freq: Once | INTRAMUSCULAR | Status: AC
  Administered 2015-06-02: 0.5 mL via INTRAMUSCULAR

## 2015-06-02 NOTE — Patient Instructions (Signed)
Influenza Virus Vaccine injection Qu es este medicamento? La VACUNA CONTRA LA INFLUENZA ayuda a disminuir el riesgo de contraer la influenza, tambin conocida como la gripe. La vacuna solo ayuda a protegerle contra algunas cepas de la gripe. Este medicamento puede ser utilizado para otros usos; si tiene alguna pregunta consulte con su proveedor de atencin mdica o con su farmacutico. Qu le debo informar a mi profesional de la salud antes de tomar este medicamento? Necesita saber si usted presenta alguno de los siguientes problemas o situaciones: -trastorno de sangrado como hemofilia -fiebre o infeccin -sndrome de Guillain-Barre u otros problemas neurolgicos -problemas del sistema inmunolgico -infeccin por el virus de la inmunodeficiencia humana (VIH) o SIDA -niveles bajos de plaquetas en la sangre -esclerosis mltiple -una reaccin alrgica o inusual a las vacunas antigripales, ltex, a otros medicamentos, alimentos, colorantes o conservantes. Distintas marcas de vacunas contienen alrgenos diferentes. Algunas pueden contener ltex o huevos. Hable con su mdico acerca de sus alergias para asegurarse de que obtendr la vacuna adecuada para usted. -si est embarazada o buscando quedar embarazada -si est amamantando a un beb Cmo debo utilizar este medicamento? Esta vacuna se administra mediante inyeccin por va intramuscular o va subcutnea. Lo administra un profesional de la salud. Recibir una copia de informacin escrita sobre la vacuna antes de cada vacuna. Asegrese de leer este folleto cada vez cuidadosamente. Este folleto puede cambiar con frecuencia. Hable con su proveedor de atencin medicamento para saber cules vacunas son adecuadas para usted. Algunas vacunas no se deben usar en todos los grupos de edades. Sobredosis: Pngase en contacto inmediatamente con un centro toxicolgico o una sala de urgencia si usted cree que haya tomado demasiado medicamento. ATENCIN: Este  medicamento es solo para usted. No comparta este medicamento con nadie. Qu sucede si me olvido de una dosis? No se aplica en este caso. Qu puede interactuar con este medicamento? -quimioterapia o radioterapia -medicamentos que suprimen el sistema inmunolgico, tales como etanercept, anakinra, infliximab y adalimumab -medicamentos que tratan o previenen cogulos sanguneos, como warfarina -fenitona -medicamentos esteroideos, como la prednisona o la cortisona -teofilina -vacunas Puede ser que esta lista no menciona todas las posibles interacciones. Informe a su profesional de la salud de todos los productos a base de hierbas, medicamentos de venta libre o suplementos nutritivos que est tomando. Si usted fuma, consume bebidas alcohlicas o si utiliza drogas ilegales, indqueselo tambin a su profesional de la salud. Algunas sustancias pueden interactuar con su medicamento. A qu debo estar atento al usar este medicamento? Informe a su mdico o a su profesional de la salud sobre todos los efectos secundarios que persistan despus de 3 das. Llame a su proveedor de atencin mdica si se presentan sntomas inusuales dentro de las 6 semanas posteriores a la vacunacin. Es posible que todava pueda contraer la gripe, pero la enfermedad no ser tan fuerte como normalmente. No puede contraer la gripe de esta vacuna. La vacuna antigripal no le protege contra resfros u otras enfermedades que pueden causar fiebre. Debe vacunarse cada ao. Qu efectos secundarios puedo tener al utilizar este medicamento? Efectos secundarios que debe informar a su mdico o a su profesional de la salud tan pronto como sea posible: -reacciones alrgicas como erupcin cutnea, picazn o urticarias, hinchazn de la cara, labios o lengua Efectos secundarios que, por lo general, no requieren atencin mdica (debe informarlos a su mdico o a su profesional de la salud si persisten o si son molestos): -fiebre -dolor de  cabeza -molestias y dolores musculares -dolor, sensibilidad,   enrojecimiento o hinchazn en el lugar de la inyeccin -cansancio Puede ser que esta lista no menciona todos los posibles efectos secundarios. Comunquese a su mdico por asesoramiento mdico Hewlett-Packardsobre los efectos secundarios. Usted puede informar los efectos secundarios a la FDA por telfono al 1-800-FDA-1088. Dnde debo guardar mi medicina? Esta Automotive engineervacunase le administrar un profesional de la salud en una [place], consultorio mdico u otro consultorio de un profesional de la salud. No se le suministrar esta vacuna para guardar en su domicilio. ATENCIN: Este folleto es un resumen. Puede ser que no cubra toda la posible informacin. Si usted tiene preguntas acerca de esta medicina, consulte con su mdico, su farmacutico o su profesional de Radiographer, therapeuticla salud.    2016, Elsevier/Gold Standard. (2014-08-26 00:00:00)

## 2015-10-13 ENCOUNTER — Ambulatory Visit: Payer: No Typology Code available for payment source | Attending: Family Medicine

## 2015-10-21 ENCOUNTER — Other Ambulatory Visit: Payer: Self-pay

## 2015-10-21 ENCOUNTER — Ambulatory Visit: Payer: Self-pay

## 2015-10-21 ENCOUNTER — Encounter: Payer: No Typology Code available for payment source | Admitting: Podiatry

## 2015-10-21 DIAGNOSIS — Z1231 Encounter for screening mammogram for malignant neoplasm of breast: Secondary | ICD-10-CM

## 2015-11-08 ENCOUNTER — Ambulatory Visit: Payer: Self-pay | Attending: Family Medicine | Admitting: Family Medicine

## 2015-11-08 ENCOUNTER — Encounter: Payer: Self-pay | Admitting: Family Medicine

## 2015-11-08 VITALS — BP 122/82 | HR 66 | Temp 97.9°F | Resp 16 | Ht 61.0 in | Wt 188.0 lb

## 2015-11-08 DIAGNOSIS — R609 Edema, unspecified: Secondary | ICD-10-CM

## 2015-11-08 DIAGNOSIS — J45909 Unspecified asthma, uncomplicated: Secondary | ICD-10-CM | POA: Insufficient documentation

## 2015-11-08 DIAGNOSIS — M25561 Pain in right knee: Secondary | ICD-10-CM | POA: Insufficient documentation

## 2015-11-08 DIAGNOSIS — J4520 Mild intermittent asthma, uncomplicated: Secondary | ICD-10-CM

## 2015-11-08 DIAGNOSIS — Z79899 Other long term (current) drug therapy: Secondary | ICD-10-CM | POA: Insufficient documentation

## 2015-11-08 DIAGNOSIS — R600 Localized edema: Secondary | ICD-10-CM | POA: Insufficient documentation

## 2015-11-08 MED ORDER — CETIRIZINE HCL 10 MG PO TABS: 10.0000 mg | ORAL_TABLET | Freq: Every day | ORAL | Status: DC

## 2015-11-08 MED ORDER — IBUPROFEN 600 MG PO TABS: 600.0000 mg | ORAL_TABLET | Freq: Three times a day (TID) | ORAL | Status: DC | PRN

## 2015-11-08 MED ORDER — MEDICAL COMPRESSION STOCKINGS MISC: 1.0000 | Freq: Every day | Status: DC

## 2015-11-08 MED ORDER — ALBUTEROL SULFATE HFA 108 (90 BASE) MCG/ACT IN AERS
2.0000 | INHALATION_SPRAY | RESPIRATORY_TRACT | Status: DC | PRN
Start: 2015-11-08 — End: 2017-05-17

## 2015-11-08 MED ORDER — FLUTICASONE-SALMETEROL 250-50 MCG/DOSE IN AEPB: 1.0000 | INHALATION_SPRAY | Freq: Two times a day (BID) | RESPIRATORY_TRACT | Status: DC

## 2015-11-08 NOTE — Progress Notes (Signed)
Subjective:  Patient ID: Michele Wright, female    DOB: May 12, 1976  Age: 40 y.o. MRN: [id number] Spanish interpreter used   CC: Knee Pain   HPI Michele Wright presents for    1. R knee pain: pain and swelling x 3 weeks. Twisting injury at work. She works in [employer] and is on her feet for 8 hrs of the day.  Medial pain. Wearing compression. No joint redness. Pain is worse at end of work day.   2. Asthma: doing well. Compliant with controller med. Rarely using rescue albuterol. Has not yet started an anti histamine.   Social History  Substance Use Topics  . Smoking status: Never Smoker   . Smokeless tobacco: Not on file  . Alcohol Use: Yes     Comment: occasionally    Outpatient Prescriptions Prior to Visit  Medication Sig Dispense Refill  . albuterol (PROVENTIL HFA;VENTOLIN HFA) 108 (90 BASE) MCG/ACT inhaler Inhale 2 puffs into the lungs every 4 (four) hours as needed for wheezing. 3 Inhaler 3  . diclofenac (VOLTAREN) 75 MG EC tablet Take 1 tablet (75 mg total) by mouth 2 (two) times daily. 60 tablet 3  . Fluticasone-Salmeterol (ADVAIR) 250-50 MCG/DOSE AEPB Inhale 1 puff into the lungs 2 (two) times daily. 3 each 3  . ibuprofen (ADVIL,MOTRIN) 600 MG tablet Take 1 tablet (600 mg total) by mouth every 8 (eight) hours as needed. 30 tablet 1  . meloxicam (MOBIC) 15 MG tablet Take 1 tablet (15 mg total) by mouth daily. 30 tablet 1  . traMADol (ULTRAM) 50 MG tablet Take 1 tablet (50 mg total) by mouth every 8 (eight) hours as needed for moderate pain. 30 tablet 0   No facility-administered medications prior to visit.    ROS Review of Systems  Constitutional: Negative for fever and chills.  Eyes: Negative for visual disturbance.  Respiratory: Negative for shortness of breath.   Cardiovascular: Negative for chest pain.  Gastrointestinal: Negative for abdominal pain and blood in stool.  Musculoskeletal: Positive for joint swelling and arthralgias. Negative for back  pain.  Skin: Negative for rash.  Allergic/Immunologic: Negative for immunocompromised state.  Hematological: Negative for adenopathy. Does not bruise/bleed easily.  Psychiatric/Behavioral: Negative for suicidal ideas and dysphoric mood.    Objective:  BP 122/82 mmHg  Pulse 66  Temp(Src) 97.9 F (36.6 C) (Oral)  Resp 16  Ht 5\' 1"  (1.549 m)  Wt 188 lb (85.276 kg)  BMI 35.54 kg/m2  SpO2 98%  LMP 10/18/2015  BP/Weight 11/08/2015 11/05/2014 10/29/2014  Systolic BP 122 124 124  Diastolic BP 82 81 84  Wt. (Lbs) 188 181 180  BMI 35.54 31.05 30.88   Physical Exam  Constitutional: She is oriented to person, place, and time. She appears well-developed and well-nourished. No distress.  HENT:  Head: Normocephalic and atraumatic.  Cardiovascular: Normal rate, regular rhythm, normal heart sounds and intact distal pulses.   Pulmonary/Chest: Effort normal and breath sounds normal.  Musculoskeletal: She exhibits no edema.       Right knee: She exhibits decreased range of motion, swelling and effusion. She exhibits no ecchymosis, no deformity, no laceration, no erythema, normal alignment, no LCL laxity, normal patellar mobility, no bony tenderness and normal meniscus. Tenderness found. Medial joint line tenderness noted. No lateral joint line, no MCL, no LCL and no patellar tendon tenderness noted.  Neurological: She is alert and oriented to person, place, and time.  Skin: Skin is warm and dry. No rash noted.  Psychiatric: She has  a normal mood and affect.   Assessment & Plan:   There are no diagnoses linked to this encounter. [name] was seen today for knee pain.  Diagnoses and all orders for this visit:  Asthma, chronic, mild intermittent, uncomplicated -     albuterol (PROVENTIL HFA;VENTOLIN HFA) 108 (90 Base) MCG/ACT inhaler; Inhale 2 puffs into the lungs every 4 (four) hours as needed for wheezing. -     Fluticasone-Salmeterol (ADVAIR) 250-50 MCG/DOSE AEPB; Inhale 1 puff into the lungs 2  (two) times daily. -     cetirizine (ZYRTEC) 10 MG tablet; Take 1 tablet (10 mg total) by mouth daily.  Right medial knee pain -     ibuprofen (ADVIL,MOTRIN) 600 MG tablet; Take 1 tablet (600 mg total) by mouth every 8 (eight) hours as needed. -     Ambulatory referral to Sports Medicine -     DG Knee AP/LAT W/Sunrise Right; Future  Peripheral edema -     Elastic Bandages & Supports (MEDICAL COMPRESSION STOCKINGS) MISC; 1 each by Does not apply route daily. 30 mmHg   Meds ordered this encounter  Medications  . albuterol (PROVENTIL HFA;VENTOLIN HFA) 108 (90 Base) MCG/ACT inhaler    Sig: Inhale 2 puffs into the lungs every 4 (four) hours as needed for wheezing.    Dispense:  3 Inhaler    Refill:  11  . Fluticasone-Salmeterol (ADVAIR) 250-50 MCG/DOSE AEPB    Sig: Inhale 1 puff into the lungs 2 (two) times daily.    Dispense:  3 each    Refill:  11  . ibuprofen (ADVIL,MOTRIN) 600 MG tablet    Sig: Take 1 tablet (600 mg total) by mouth every 8 (eight) hours as needed.    Dispense:  30 tablet    Refill:  1  . cetirizine (ZYRTEC) 10 MG tablet    Sig: Take 1 tablet (10 mg total) by mouth daily.    Dispense:  30 tablet    Refill:  11  . Elastic Bandages & Supports (MEDICAL COMPRESSION STOCKINGS) MISC    Sig: 1 each by Does not apply route daily. 30 mmHg    Dispense:  2 each    Refill:  0    Follow-up: No Follow-up on file.   Dessa Phi MD

## 2015-11-08 NOTE — Progress Notes (Signed)
C/C rt knee swelling and pain  Pt stated turn around and heard a popping noise  Pain scale # 5 No tobacco user  No suicidal thought in the past two weeks

## 2015-11-08 NOTE — Patient Instructions (Addendum)
Byrd HesselbachMaria was seen today for knee pain.  Diagnoses and all orders for this visit:  Asthma, chronic, mild intermittent, uncomplicated -     albuterol (PROVENTIL HFA;VENTOLIN HFA) 108 (90 Base) MCG/ACT inhaler; Inhale 2 puffs into the lungs every 4 (four) hours as needed for wheezing. -     Fluticasone-Salmeterol (ADVAIR) 250-50 MCG/DOSE AEPB; Inhale 1 puff into the lungs 2 (two) times daily. -     cetirizine (ZYRTEC) 10 MG tablet; Take 1 tablet (10 mg total) by mouth daily.  Right medial knee pain -     ibuprofen (ADVIL,MOTRIN) 600 MG tablet; Take 1 tablet (600 mg total) by mouth every 8 (eight) hours as needed. -     Ambulatory referral to Sports Medicine -     DG Knee AP/LAT W/Sunrise Right; Future  Peripheral edema -     Elastic Bandages & Supports (MEDICAL COMPRESSION STOCKINGS) MISC; 1 each by Does not apply route daily. 30 mmHg   F/u in 6-8 for R medial knee pain  Dr. Armen PickupFunches

## 2015-11-09 MED FILL — ?CETIRIZINE HCL 10 MG TABLE: 10 | 30 days supply | Qty: 30 | Fill #0

## 2015-11-09 MED FILL — IBUPROFEN 600 MG TABLET: 600 | 10 days supply | Qty: 30 | Fill #0

## 2015-11-11 ENCOUNTER — Ambulatory Visit
Admission: RE | Admit: 2015-11-11 | Discharge: 2015-11-11 | Disposition: A | Discharge status: Home / Self Care | Payer: No Typology Code available for payment source | Source: Ambulatory Visit

## 2015-11-11 DIAGNOSIS — R609 Edema, unspecified: Secondary | ICD-10-CM | POA: Insufficient documentation

## 2015-11-11 DIAGNOSIS — Z1231 Encounter for screening mammogram for malignant neoplasm of breast: Secondary | ICD-10-CM

## 2015-11-11 DIAGNOSIS — R600 Localized edema: Secondary | ICD-10-CM | POA: Insufficient documentation

## 2015-11-11 DIAGNOSIS — M25561 Pain in right knee: Secondary | ICD-10-CM | POA: Insufficient documentation

## 2015-11-11 NOTE — Progress Notes (Signed)
This encounter was created in error - please disregard.

## 2015-11-11 NOTE — Assessment & Plan Note (Signed)
Chronic asthma well controlled Add zyrtec during allergy season

## 2015-11-11 NOTE — Assessment & Plan Note (Signed)
Medial knee pain following twisting injury There is concern for soft tissue injury  Plan for NSAID, x-ray[facility]

## 2015-11-30 ENCOUNTER — Ambulatory Visit: Payer: Self-pay | Admitting: Family Medicine

## 2015-11-30 ENCOUNTER — Ambulatory Visit: Payer: No Typology Code available for payment source | Admitting: Family Medicine

## 2015-12-14 ENCOUNTER — Ambulatory Visit: Payer: Self-pay | Admitting: Family Medicine

## 2016-01-11 ENCOUNTER — Encounter: Payer: Self-pay | Admitting: Family Medicine

## 2016-01-11 ENCOUNTER — Ambulatory Visit: Payer: No Typology Code available for payment source | Attending: Family Medicine | Admitting: Family Medicine

## 2016-01-11 VITALS — BP 118/77 | HR 70 | Temp 98.2°F | Resp 12 | Ht 61.0 in | Wt 184.4 lb

## 2016-01-11 DIAGNOSIS — Z79899 Other long term (current) drug therapy: Secondary | ICD-10-CM | POA: Insufficient documentation

## 2016-01-11 DIAGNOSIS — M25561 Pain in right knee: Secondary | ICD-10-CM | POA: Insufficient documentation

## 2016-01-11 NOTE — Patient Instructions (Addendum)
Byrd HesselbachMaria was seen today for knee pain.  Diagnoses and all orders for this visit:  Right medial knee pain -     MR Knee Right Wo Contrast; Future    MRI ordered [facility]  Continue ibuprofen with food as needed for pain RICE therapy for knee   F/u in 6 weeks for knee pain  Dr. Armen PickupFunches   RHCE para los cuidados de rutina de las lesiones (RICE for Routine Care of Injuries) Los cuidados de rutina de muchas lesiones incluyen reposo, hielo, compresin y elevacin (RHCE). A menudo se recomienda la estrategia de RHCE para las lesiones de los tejidos blandos, por ejemplo, una distensin muscular, las lesiones de los ligamentos, los hematomas y las lesiones por uso excesivo. Tambin se puede utilizar para Fortune Brandsalgunas lesiones seas. El uso de la Economistestrategia de RHCE puede ayudar a Engineer, materialsaliviar el dolor, reducir la hinchazn y permitir que el cuerpo se recupere. Reposo El reposo es necesario para permitir que el cuerpo se recupere. Habitualmente, esto implica reducir las [address]actividades normales y Automotive engineerevitar el uso de la zona lesionada del cuerpo. Por lo general, puede reanudar sus actividades normales cuando se siente cmodo y el mdico se lo ha autorizado. Hielo Aplicar hielo en una lesin sirve para evitar la hinchazn y Engineer, materialsdisminuye el dolor. No aplique el hielo directamente sobre la piel.  Ponga el hielo en una bolsa plstica.  Coloque una toalla entre la piel y la bolsa de hielo.  Coloque el hielo durante 20 minutos, 2 a 3 veces por da. Hgalo durante el tiempo que el mdico se lo haya indicado. Compresin La compresin implica ejercer presin sobre la zona lesionada. La compresin ayuda a evitar la hinchazn, brinda sujecin y [place] Grenadinesayuda con las [address]estias. Una venda elstica sirve para la compresin. Si tiene IT consultantuna venda elstica, siga estos consejos generales:  Qutese y vuelva a colocarse la venda cada 3 o 4horas, o como se lo haya indicado el mdico.  Asegrese de que la venda  no est muy [place]issajustada, ya que esto puede cortarle la circulacin. Si una parte del cuerpo ms all de la venda se torna color azul, se adormece, se enfra, se hincha o le causa ms dolor, es probable que la venda est [place] Islandsmuy ajustada. Si eso ocurre, retire la venda y vuelva a colocarla ms floja.  Consulte al mdico si la venda parece estar agravando los problemas en lugar de mejorndolos. Elevacin La elevacin implica mantener elevada la zona lesionada. Esto ayuda a Engineer, materialsaliviar el dolor y Building services engineerreducir la hinchazn. Si es posible, la zona lesionada debe elevarse al nivel del corazn o del centro del pecho, o por encima de [place]. CUNDO DEBO BUSCAR ATENCIN MDICA? Debe buscar atencin mdica en las siguientes situaciones:  El dolor y la hinchazn continan.  Los sntomas empeoran en vez de Scientist, clinical (histocompatibility and immunogenetics)mejorar. Estos sntomas pueden indicar que es necesaria una evaluacin ms profunda o nuevas radiografas. En algunos casos, las radiografas no muestran si hay un hueso pequeo roto (fractura) hasta varios das ms tarde. Concurra a las citas de control con el mdico. CUNDO DEBO BUSCAR ASISTENCIA MDICA INMEDIATA? Solicite atencin IAC/InterActiveCorpmdica inmediatamente en las siguientes situaciones:  Siente un dolor intenso repentino en la zona de la lesin o por debajo de esta.  Tiene enrojecimiento o aumenta la hinchazn alrededor de la lesin.  Tiene hormigueo o adormecimiento en la zona de la lesin o por debajo de esta que no mejoran despus de quitarse la venda Adult nurseelstica.   Esta informacin no  tiene Theme park managercomo fin reemplazar el consejo del mdico. Asegrese de hacerle al mdico cualquier pregunta que tenga.   Document Released: 04/12/2005 Document Revised: 07/08/2013 Elsevier Interactive Patient Education [employer]2016 Elsevier Inc.

## 2016-01-11 NOTE — Progress Notes (Signed)
Subjective:  Patient ID: Michele Wright, female    DOB: 02-03-1976  Age: 40 y.o. MRN: [id number] Spanish interpreter used   CC: Knee Pain   HPI Michele Wright presents for    1. R knee pain: for the past 3 months. Started as a twisting injury at work. [facility] She had a normal knee x-ray. MRI was recommended but she is uninsured and could not afford MRI. Currently she has medial knee pain, stiffness in her knee, cracking in her knee and swelling. Her pain level is 5-8/10. Pain is not constant. Pain is exacerbated by walking and rising from standing. She is taking ibuprofen for the knee pain and it helps.    Social History  Substance Use Topics  . Smoking status: Never Smoker   . Smokeless tobacco: Not on file  . Alcohol Use: Yes     Comment: occasionally    Outpatient Prescriptions Prior to Visit  Medication Sig Dispense Refill  . albuterol (PROVENTIL HFA;VENTOLIN HFA) 108 (90 Base) MCG/ACT inhaler Inhale 2 puffs into the lungs every 4 (four) hours as needed for wheezing. 3 Inhaler 11  . cetirizine (ZYRTEC) 10 MG tablet Take 1 tablet (10 mg total) by mouth daily. 30 tablet 11  . Elastic Bandages & Supports (MEDICAL COMPRESSION STOCKINGS) MISC 1 each by Does not apply route daily. 30 mmHg 2 each 0  . Fluticasone-Salmeterol (ADVAIR) 250-50 MCG/DOSE AEPB Inhale 1 puff into the lungs 2 (two) times daily. 3 each 11  . ibuprofen (ADVIL,MOTRIN) 600 MG tablet Take 1 tablet (600 mg total) by mouth every 8 (eight) hours as needed. 30 tablet 1   No facility-administered medications prior to visit.    ROS Review of Systems  Constitutional: Negative for fever and chills.  Eyes: Negative for visual disturbance.  Respiratory: Negative for shortness of breath.   Cardiovascular: Negative for chest pain.  Gastrointestinal: Negative for abdominal pain and blood in stool.  Musculoskeletal: Positive for joint swelling and arthralgias. Negative for back pain.    Skin: Negative for rash.  Allergic/Immunologic: Negative for immunocompromised state.  Hematological: Negative for adenopathy. Does not bruise/bleed easily.  Psychiatric/Behavioral: Negative for suicidal ideas and dysphoric mood.    Objective:  BP 118/77 mmHg  Pulse 70  Temp(Src) 98.2 F (36.8 C) (Oral)  Resp 12  Ht 5\' 1"  (1.549 m)  Wt 184 lb 6.4 oz (83.643 kg)  BMI 34.86 kg/m2  SpO2 94%  BP/Weight 01/11/2016 11/08/2015 11/05/2014  Systolic BP 118 122 124  Diastolic BP 77 82 81  Wt. (Lbs) 184.4 188 181  BMI 34.86 35.54 31.05   Physical Exam  Constitutional: She is oriented to person, place, and time. She appears well-developed and well-nourished. No distress.  HENT:  Head: Normocephalic and atraumatic.  Pulmonary/Chest: Effort normal.  Musculoskeletal: She exhibits no edema.       Right knee: She exhibits decreased range of motion, swelling and effusion. She exhibits no ecchymosis, no deformity, no laceration, no erythema, normal alignment, no LCL laxity, normal patellar mobility, no bony tenderness and normal meniscus. Tenderness found. Medial joint line tenderness noted. No lateral joint line, no MCL, no LCL and no patellar tendon tenderness noted.  Neurological: She is alert and oriented to person, place, and time.  Skin: Skin is warm and dry. No rash noted.  Psychiatric: She has a normal mood and affect.   Assessment & Plan:   There are no diagnoses linked to this encounter. Michele Wright was seen today for knee pain.  Diagnoses and all orders for this visit:  Right medial knee pain -     MR Knee Right Wo Contrast; Future   No orders of the defined types were placed in this encounter.    Follow-up: Return in about 6 weeks (around 02/22/2016) for Right knee pain .   Dessa PhiJosalyn Clent Damore MD

## 2016-01-11 NOTE — Progress Notes (Signed)
Pt here for right knee pain. Pain rated at a 5. Pt reports when she bends knee to walk it cracks and that when it hurts. Pt reports if standing to long, her knee gets stuck. Pain has been present for . Pt received x-ray which was normal.

## 2016-01-11 NOTE — Assessment & Plan Note (Signed)
A: R knee pain now for 3 months. Soft tissue injury suspected.  P: MRI of knee  Continue ibuprofen and add RICE therapy for pain control  Patient signed a release for medical records

## 2016-01-14 ENCOUNTER — Ambulatory Visit: Payer: No Typology Code available for payment source | Attending: Family Medicine

## 2016-01-20 ENCOUNTER — Ambulatory Visit (HOSPITAL_COMMUNITY)
Admission: RE | Admit: 2016-01-20 | Discharge: 2016-01-20 | Disposition: A | Discharge status: Home / Self Care | Payer: Self-pay | Source: Ambulatory Visit | Attending: Family Medicine | Admitting: Family Medicine

## 2016-01-20 DIAGNOSIS — M659 Synovitis and tenosynovitis, unspecified: Secondary | ICD-10-CM | POA: Insufficient documentation

## 2016-01-20 DIAGNOSIS — M25461 Effusion, right knee: Secondary | ICD-10-CM | POA: Insufficient documentation

## 2016-01-20 DIAGNOSIS — M71561 Other bursitis, not elsewhere classified, right knee: Secondary | ICD-10-CM | POA: Insufficient documentation

## 2016-01-20 DIAGNOSIS — M25561 Pain in right knee: Secondary | ICD-10-CM | POA: Insufficient documentation

## 2016-01-20 DIAGNOSIS — M1711 Unilateral primary osteoarthritis, right knee: Secondary | ICD-10-CM | POA: Insufficient documentation

## 2016-01-26 ENCOUNTER — Telehealth: Payer: Self-pay

## 2016-01-26 NOTE — Telephone Encounter (Signed)
[employer] [place] [phone]#[id number] contacted patient to go over xray results. Patient did not answer lvm for patient to give me a call at their earliest convenience

## 2016-01-27 ENCOUNTER — Telehealth: Payer: Self-pay | Admitting: Family Medicine

## 2016-01-27 NOTE — Telephone Encounter (Signed)
[employer] [place] Id# [id number] contacted patient is aware of MRI results and [facility] will [facility]

## 2016-01-27 NOTE — Telephone Encounter (Signed)
Pt. Came in requesting her MRI results. Pt. Stated that if she could not be reached  that it was ok to LVM. Please f/u with pt.

## 2016-01-31 NOTE — Telephone Encounter (Signed)
Pt. Called stating that she needs to be referred out to an orthopedic. Pt. Has the OC and the cone discount. Pt. Called [facility] and they need stated they need the money up front. Pt. Would like to know if there is another place where she can go and make payments. Please f/u with pt.

## 2016-02-01 NOTE — Telephone Encounter (Signed)
I spoke to patient and I tell her that I would sent the referral by fax to [facility] Ph# [phone] address [address]. They will contact the patient for an appointment .

## 2016-04-17 ENCOUNTER — Ambulatory Visit: Payer: Self-pay | Attending: Family Medicine

## 2016-05-01 ENCOUNTER — Ambulatory Visit (INDEPENDENT_AMBULATORY_CARE_PROVIDER_SITE_OTHER): Payer: Self-pay | Admitting: Orthopaedic Surgery

## 2016-05-01 DIAGNOSIS — M1711 Unilateral primary osteoarthritis, right knee: Secondary | ICD-10-CM

## 2016-05-11 MED FILL — ?MELOXICAM 7.5 MG TABLET: 7.5 | 30 days supply | Qty: 60 | Fill #0

## 2016-10-20 ENCOUNTER — Ambulatory Visit: Payer: Self-pay | Attending: Family Medicine

## 2016-10-24 ENCOUNTER — Other Ambulatory Visit: Payer: Self-pay | Admitting: Family Medicine

## 2016-10-24 DIAGNOSIS — Z1231 Encounter for screening mammogram for malignant neoplasm of breast: Secondary | ICD-10-CM

## 2016-11-16 ENCOUNTER — Ambulatory Visit
Admission: RE | Admit: 2016-11-16 | Discharge: 2016-11-16 | Disposition: A | Discharge status: Home / Self Care | Payer: No Typology Code available for payment source | Source: Ambulatory Visit | Attending: Family Medicine | Admitting: Family Medicine

## 2016-11-16 DIAGNOSIS — Z1231 Encounter for screening mammogram for malignant neoplasm of breast: Secondary | ICD-10-CM

## 2016-11-29 ENCOUNTER — Encounter: Payer: Self-pay | Admitting: Family Medicine

## 2017-04-05 ENCOUNTER — Ambulatory Visit: Payer: No Typology Code available for payment source | Admitting: Family Medicine

## 2017-05-17 ENCOUNTER — Encounter: Payer: Self-pay | Admitting: Family Medicine

## 2017-05-17 ENCOUNTER — Ambulatory Visit: Payer: Self-pay | Attending: Family Medicine | Admitting: Family Medicine

## 2017-05-17 VITALS — BP 102/63 | HR 66 | Temp 98.1°F | Resp 18 | Ht 61.0 in | Wt 191.2 lb

## 2017-05-17 DIAGNOSIS — J4540 Moderate persistent asthma, uncomplicated: Secondary | ICD-10-CM | POA: Insufficient documentation

## 2017-05-17 DIAGNOSIS — J302 Other seasonal allergic rhinitis: Secondary | ICD-10-CM

## 2017-05-17 DIAGNOSIS — Z0000 Encounter for general adult medical examination without abnormal findings: Secondary | ICD-10-CM

## 2017-05-17 DIAGNOSIS — Z7951 Long term (current) use of inhaled steroids: Secondary | ICD-10-CM | POA: Insufficient documentation

## 2017-05-17 DIAGNOSIS — H5789 Other specified disorders of eye and adnexa: Secondary | ICD-10-CM | POA: Insufficient documentation

## 2017-05-17 DIAGNOSIS — Z79899 Other long term (current) drug therapy: Secondary | ICD-10-CM | POA: Insufficient documentation

## 2017-05-17 DIAGNOSIS — Z23 Encounter for immunization: Secondary | ICD-10-CM | POA: Insufficient documentation

## 2017-05-17 MED ORDER — FLUTICASONE-SALMETEROL 500-50 MCG/DOSE IN AEPB: 1.0000 | INHALATION_SPRAY | Freq: Two times a day (BID) | RESPIRATORY_TRACT | 6 refills | Status: DC

## 2017-05-17 MED ORDER — MONTELUKAST SODIUM 10 MG PO TABS: 10.0000 mg | ORAL_TABLET | Freq: Every day | ORAL | 11 refills | Status: DC

## 2017-05-17 MED ORDER — KETOTIFEN FUMARATE 0.025 % OP SOLN: 1.0000 [drp] | Freq: Two times a day (BID) | OPHTHALMIC | 0 refills | Status: DC | PRN

## 2017-05-17 MED ORDER — ALBUTEROL SULFATE HFA 108 (90 BASE) MCG/ACT IN AERS: 2.0000 | INHALATION_SPRAY | RESPIRATORY_TRACT | 6 refills | Status: DC | PRN

## 2017-05-17 NOTE — Progress Notes (Signed)
Subjective:  Patient ID: Michele Wright, female    DOB: 1975/10/31  Age: 41 y.o. MRN: [id number]  CC: Establish Care   HPI Michele Wright presents to re-establish care and for asthma f/u. Interpreter services used [phone] Elisa . She is not currently in exacerbation. asthma symptoms include  dyspnea, non-productive cough and wheezing and occur almost daily. Observed precipitants include occupational exposure. She denies any recent history of  Emergency Department visits in the last month for asthma. She reports using her albuterol inhaler almost daily - 4 days  Per week. Eye itching: Bilateral. Symptoms are intermittent. She denies any visual problems. Associated symptoms include clear watery drainage. Denies any redness or contact with similar symptoms. She does report history of seasonal allergies.    Outpatient Medications Prior to Visit  Medication Sig Dispense Refill  . albuterol (PROVENTIL HFA;VENTOLIN HFA) 108 (90 Base) MCG/ACT inhaler Inhale 2 puffs into the lungs every 4 (four) hours as needed for wheezing. 3 Inhaler 11  . Elastic Bandages & Supports (MEDICAL COMPRESSION STOCKINGS) MISC 1 each by Does not apply route daily. 30 mmHg 2 each 0  . ibuprofen (ADVIL,MOTRIN) 600 MG tablet Take 1 tablet (600 mg total) by mouth every 8 (eight) hours as needed. 30 tablet 1  . cetirizine (ZYRTEC) 10 MG tablet Take 1 tablet (10 mg total) by mouth daily. 30 tablet 11  . Fluticasone-Salmeterol (ADVAIR) 250-50 MCG/DOSE AEPB Inhale 1 puff into the lungs 2 (two) times daily. 3 each 11   No facility-administered medications prior to visit.     ROS Review of Systems  Constitutional: Negative.   HENT: Negative.   Eyes: Positive for discharge (watery) and itching.  Respiratory: Negative.   Cardiovascular: Negative.   Neurological: Negative.   Psychiatric/Behavioral: Negative.      Objective:  BP 102/63 (BP Location: Left Arm, Patient Position: Sitting, Cuff Size: Normal)   Pulse 66    Temp 98.1 F (36.7 C) (Oral)   Resp 18   Ht 5\' 1"  (1.549 m)   Wt 191 lb 3.2 oz (86.7 kg)   SpO2 98%   BMI 36.13 kg/m   BP/Weight 05/17/2017 01/11/2016 11/08/2015  Systolic BP 102 118 122  Diastolic BP 63 77 82  Wt. (Lbs) 191.2 184.4 188  BMI 36.13 34.86 35.54     Physical Exam  Constitutional: She appears well-developed and well-nourished.  HENT:  Head: Normocephalic and atraumatic.  Right Ear: External ear normal.  Left Ear: External ear normal.  Nose: Nose normal.  Mouth/Throat: Oropharynx is clear and moist.  Eyes: Conjunctivae and EOM are normal. Pupils are equal, round, and reactive to light. Right eye exhibits no discharge and no exudate. Left eye exhibits no discharge and no exudate.  Neck: Normal range of motion. Neck supple.  Cardiovascular: Normal rate, regular rhythm, normal heart sounds and intact distal pulses.   Pulmonary/Chest: Effort normal and breath sounds normal. She has no wheezes.  Skin: Skin is warm and dry.  Nursing note and vitals reviewed.    Assessment & Plan:   1. Moderate persistent asthma without complication  - Fluticasone-Salmeterol (ADVAIR DISKUS) 500-50 MCG/DOSE AEPB; Inhale 1 puff into the lungs 2 (two) times daily.  Dispense: 60 each; Refill: 6 - albuterol (PROVENTIL HFA;VENTOLIN HFA) 108 (90 Base) MCG/ACT inhaler; Inhale 2 puffs into the lungs every 4 (four) hours as needed for wheezing.  Dispense: 3 Inhaler; Refill: 6 - montelukast (SINGULAIR) 10 MG tablet; Take 1 tablet (10 mg total) by mouth at bedtime.  Dispense: 30  tablet; Refill: 11 - ketotifen (ALAWAY) 0.025 % ophthalmic solution; Place 1 drop into both eyes 2 (two) times daily as needed.  Dispense: 5 mL; Refill: 0  2. Seasonal allergies  - montelukast (SINGULAIR) 10 MG tablet; Take 1 tablet (10 mg total) by mouth at bedtime.  Dispense: 30 tablet; Refill: 11 - ketotifen (ALAWAY) 0.025 % ophthalmic solution; Place 1 drop into both eyes 2 (two) times daily as needed.  Dispense: 5  mL; Refill: 0  3. Needs flu shot  - Flu Vaccine QUAD 6+ mos PF IM (Fluarix Quad PF)  4. Health care maintenance  - Tdap vaccine greater than or equal to 7yo IM      Follow-up: Return in about 8 weeks (around 07/12/2017) for Asthma.   Lizbeth Bark FNP

## 2017-05-17 NOTE — Progress Notes (Signed)
Patient is here for asthma

## 2017-05-22 ENCOUNTER — Other Ambulatory Visit: Payer: Self-pay | Admitting: Family Medicine

## 2017-05-22 DIAGNOSIS — J4540 Moderate persistent asthma, uncomplicated: Secondary | ICD-10-CM

## 2017-05-25 MED FILL — ?MONTELUKAST SOD 10MG TAB: 10 | 30 days supply | Qty: 30 | Fill #0

## 2017-05-30 ENCOUNTER — Ambulatory Visit: Payer: Self-pay

## 2017-07-12 IMAGING — MR MR KNEE*R* W/O CM
4 of 5 series · 18 of 40 positions shown · non-contrast
Comparison: None.

CLINICAL DATA: Right medial knee pain for 3 months. Twisting injury
at work.

EXAM:
MRI OF THE RIGHT KNEE WITHOUT CONTRAST
TECHNIQUE: Multiplanar, multisequence MR imaging of the knee was performed. No
intravenous contrast was administered.

[Series 2: PD fat-sat · axial · 4.0mm · 0.29mm/px · z∈[-36,+97]mm · 8 of 28 slices shown (1 of 3)]
[im 1/28]
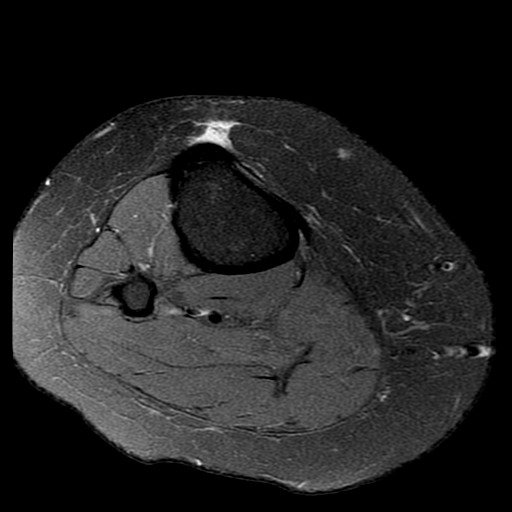
[im 4/28]
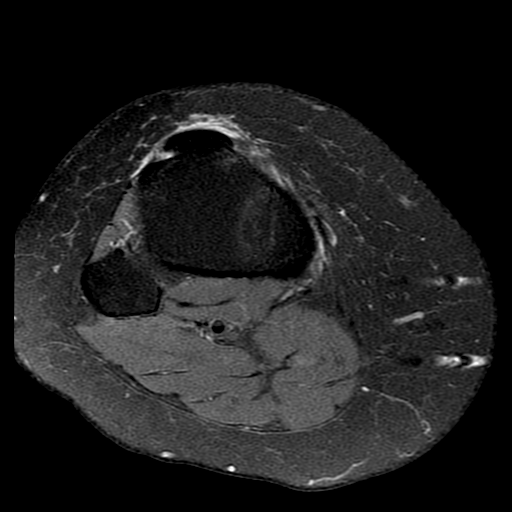
[im 8/28]
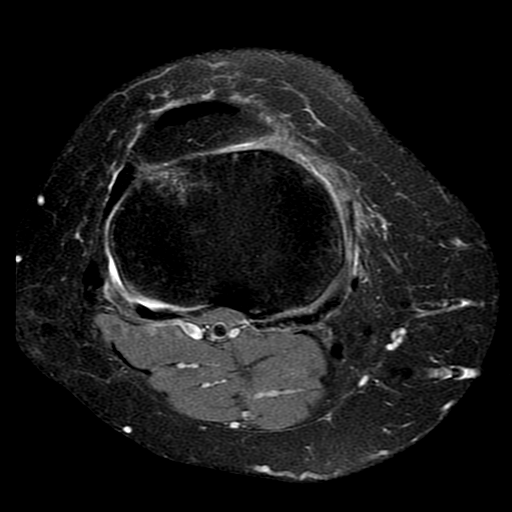
[im 12/28]
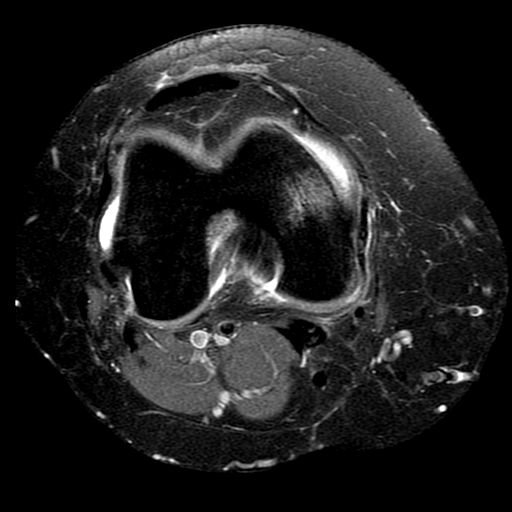
[im 16/28]
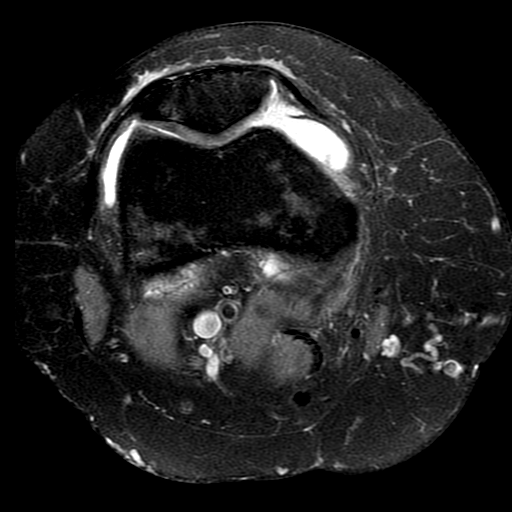
[im 20/28]
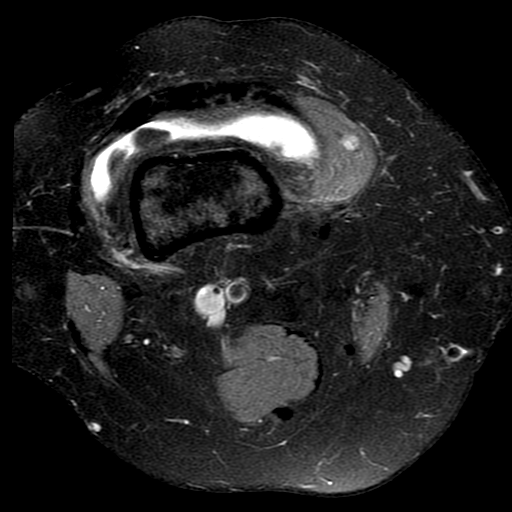
[im 24/28]
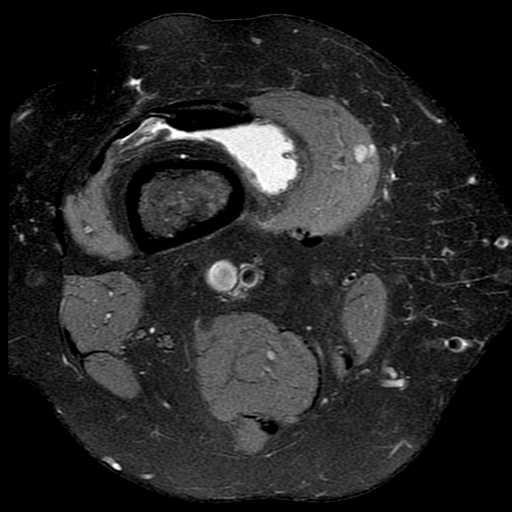
[im 28/28]
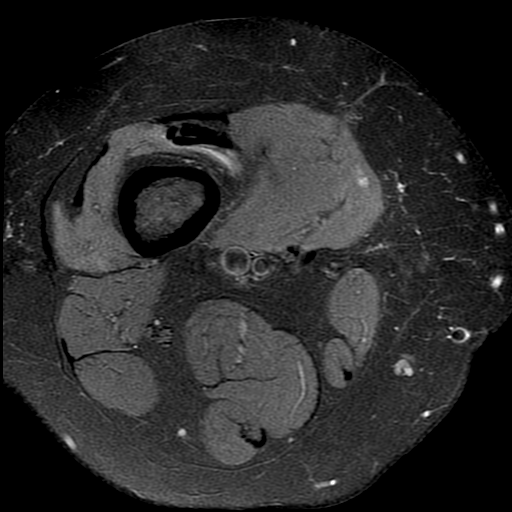

[Series 3: PD fat-sat · coronal · 4.0mm · 0.31mm/px · 4 of 27 slices shown (2 of 3)]
[im 1/27]
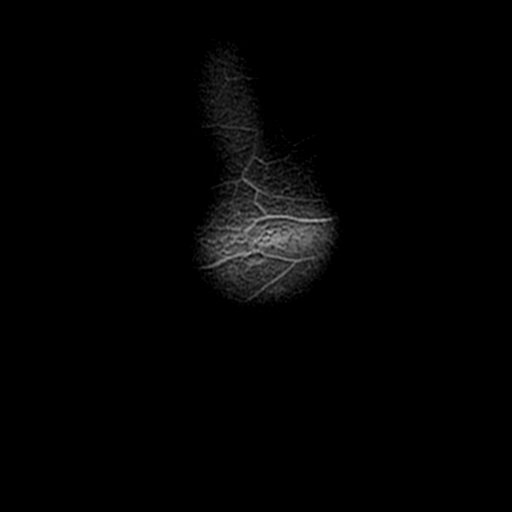
[im 4/27]
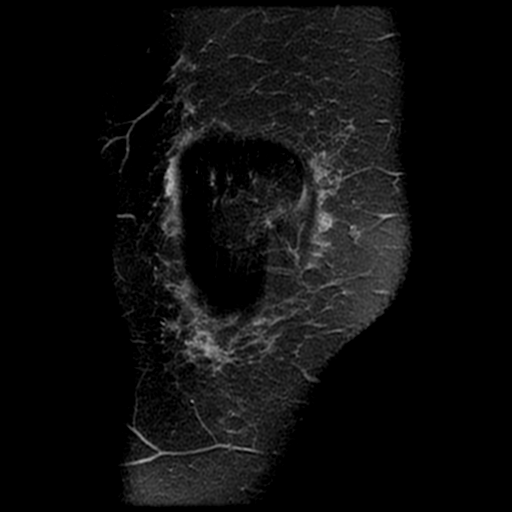
[im 15/27]
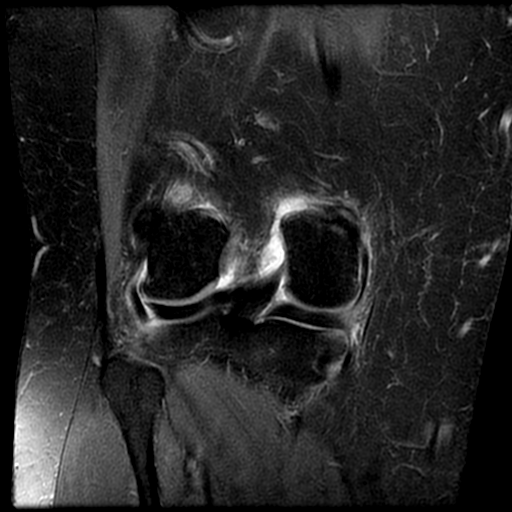
[im 23/27]
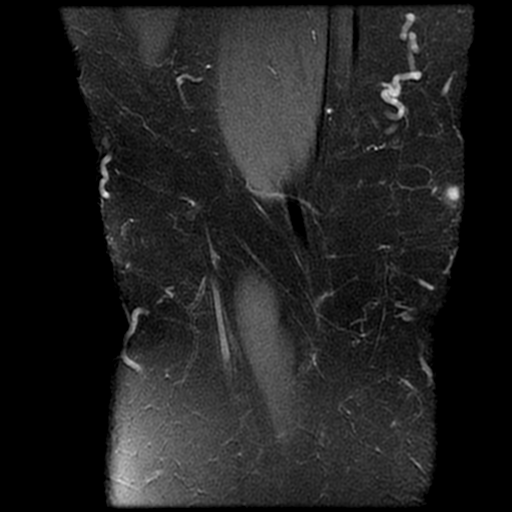

[Series 4: T2 fat-sat · coronal · 4.0mm · 0.31mm/px · 3 of 27 slices shown]
[im 4/27]
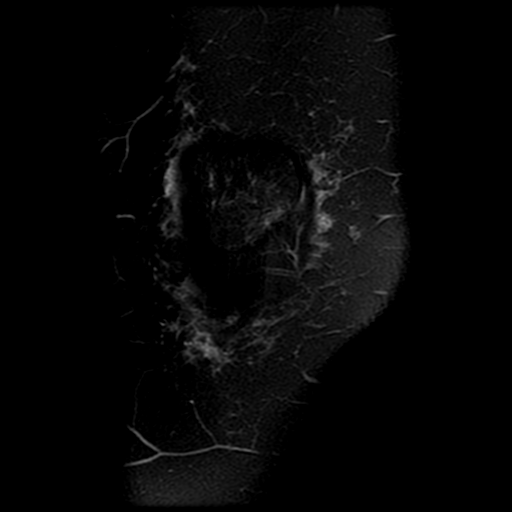
[im 15/27]
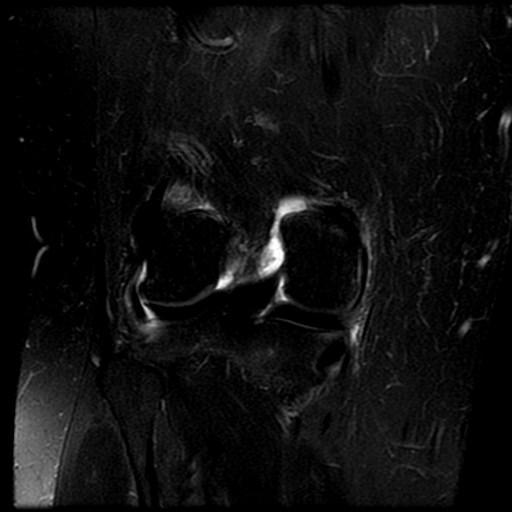
[im 23/27]
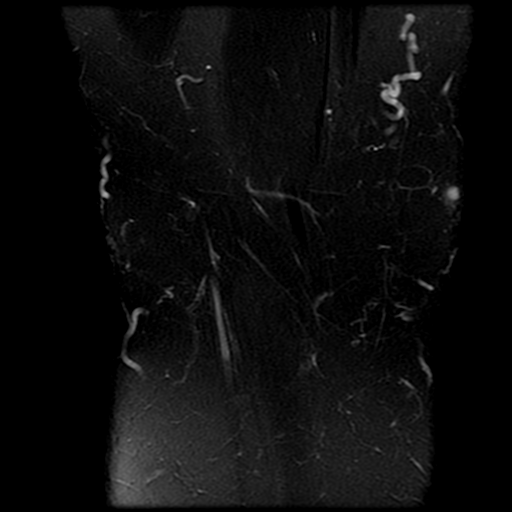

[Series 5: PD fat-sat · sagittal · 4.0mm · 0.29mm/px · 3 of 28 slices shown (3 of 3)]
[im 4/28]
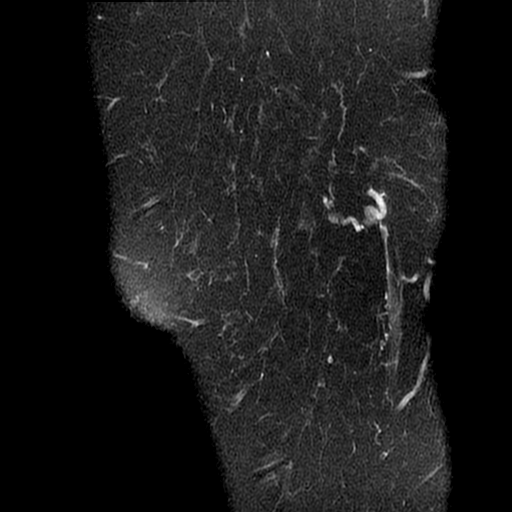
[im 16/28]
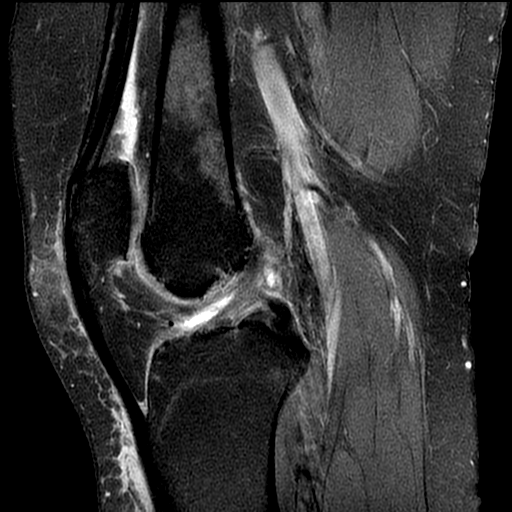
[im 24/28]
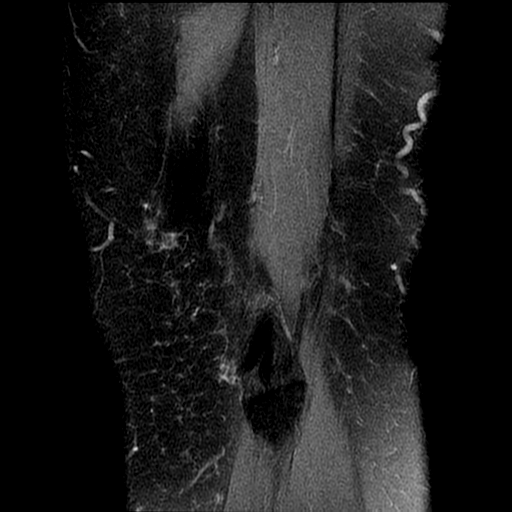

[18 of 40 positions shown; findings below may reference images not displayed]

FINDINGS: MENISCI

Medial meniscus:  Intact.

Lateral meniscus:  Intact.

LIGAMENTS

Cruciates:  Intact.  Mild mucoid degeneration of the ACL.

Collaterals:  Intact.  Mild MCL and pes anserine bursitis.

CARTILAGE

Patellofemoral: Moderate degenerative chondrosis without
full-thickness cartilage defect.

Medial: Moderate to advanced degenerative chondrosis/chondromalacia
with area of full or near full-thickness cartilage loss, joint space
narrowing, osteophytic spurring and moderate subchondral edema in
the femur. I do not see a discrete stress fracture. This is likely a
stress reaction.

Lateral:  Mild degenerative chondrosis.

Joint: Small joint effusion and mild synovitis. Superior and medial
patellar plica are noted.

Popliteal Fossa:  No popliteal mass or Baker's cyst.

Extensor Mechanism: The patella retinacular structures are intact
and the quadriceps and patellar tendons are intact.

Bones: Moderate marrow edema in the medial femoral condyle as
discussed above. Patchy metaphyseal marrow signal abnormality not
uncommon in young females with this body habitus.

Other: None
IMPRESSION: 1. Intact ligamentous structures and no acute bony abnormality. Mild
mucoid degeneration of the ACL.
2. No meniscal tears. Mild degenerative changes involving the medial
meniscus.
3. Significant medial compartment degenerative changes with areas of
full or near full-thickness cartilage loss, joint space narrowing,
osteophytic spurring and moderate subchondral edema in the medial
femoral condyles likely a stress reaction without definite
subchondral stress fracture.
4. Mild MCL and pes anserine bursitis.
5. Small joint effusion and mild synovitis.

## 2017-09-28 ENCOUNTER — Other Ambulatory Visit: Payer: Self-pay | Admitting: Family Medicine

## 2017-09-28 DIAGNOSIS — Z1231 Encounter for screening mammogram for malignant neoplasm of breast: Secondary | ICD-10-CM

## 2017-10-10 ENCOUNTER — Ambulatory Visit: Payer: Self-pay | Admitting: Nurse Practitioner

## 2017-10-17 ENCOUNTER — Ambulatory Visit (INDEPENDENT_AMBULATORY_CARE_PROVIDER_SITE_OTHER): Payer: Self-pay | Admitting: Physician Assistant

## 2017-10-17 ENCOUNTER — Encounter (INDEPENDENT_AMBULATORY_CARE_PROVIDER_SITE_OTHER): Payer: Self-pay | Admitting: Physician Assistant

## 2017-10-17 ENCOUNTER — Other Ambulatory Visit: Payer: Self-pay

## 2017-10-17 VITALS — BP 122/84 | HR 61 | Temp 98.0°F | Ht 60.63 in | Wt 194.8 lb

## 2017-10-17 DIAGNOSIS — E7841 Elevated Lipoprotein(a): Secondary | ICD-10-CM

## 2017-10-17 DIAGNOSIS — J302 Other seasonal allergic rhinitis: Secondary | ICD-10-CM

## 2017-10-17 DIAGNOSIS — Z131 Encounter for screening for diabetes mellitus: Secondary | ICD-10-CM

## 2017-10-17 DIAGNOSIS — I872 Venous insufficiency (chronic) (peripheral): Secondary | ICD-10-CM

## 2017-10-17 DIAGNOSIS — J4540 Moderate persistent asthma, uncomplicated: Secondary | ICD-10-CM

## 2017-10-17 LAB — POCT GLYCOSYLATED HEMOGLOBIN (HGB A1C): Hemoglobin A1C: 5.9

## 2017-10-17 MED ORDER — FLUTICASONE-SALMETEROL 500-50 MCG/DOSE IN AEPB: 1.0000 | INHALATION_SPRAY | Freq: Two times a day (BID) | RESPIRATORY_TRACT | 6 refills | Status: DC

## 2017-10-17 MED ORDER — MONTELUKAST SODIUM 10 MG PO TABS: 10.0000 mg | ORAL_TABLET | Freq: Every day | ORAL | 11 refills | Status: DC

## 2017-10-17 MED ORDER — ALBUTEROL SULFATE HFA 108 (90 BASE) MCG/ACT IN AERS: 2.0000 | INHALATION_SPRAY | RESPIRATORY_TRACT | 6 refills | Status: DC | PRN

## 2017-10-17 MED FILL — ?MONTELUKAST SOD 10 MG TAB: 10 | 30 days supply | Qty: 30 | Fill #0

## 2017-10-17 MED FILL — !VENTOLIN HFA INHALER: 108 (90 BAS | 16 days supply | Qty: 18 | Fill #0

## 2017-10-17 MED FILL — !ADVAIR 500/50 DISKUS: 500-50 | 30 days supply | Qty: 60 | Fill #0

## 2017-10-17 NOTE — Patient Instructions (Signed)
Enfermedad vascular perifrica (Peripheral Vascular Disease) La enfermedad vascular perifrica (EVP) es una enfermedad de los vasos sanguneos que no forman parte del corazn y el cerebro. El trmino simple que se utiliza para la EVP es "mala circulacin". En la mayora de los casos, la EVP estrecha los vasos sanguneos que transportan la sangre del corazn al resto del organismo. Esto puede generar un menor suministro de sangre a los brazos, las piernas y los rganos internos, como el estmago o los riones. No obstante, en la mayora de los casos afecta la parte inferior de las piernas y los pies de la persona. Hay dos tipos de EVP.  EVP orgnica. Este es el tipo ms comn. Su causa es el dao en la estructura de los vasos sanguneos.  EVP funcional. Es causada por enfermedades que provocan la contraccin y el estrechamiento (espasmo) de los vasos sanguneos. Si no se recibe tratamiento, la EVP tiende a empeorar con el transcurso del tiempo. La EVP tambin puede provocar isquemia aguda en las extremidades. Esto se produce cuando un brazo o una extremidad, de repente, no puede recibir suficiente cantidad de sangre. Esto es una emergencia mdica. CUIDADOS EN EL HOGAR  Tome los medicamentos solamente como se lo haya indicado el mdico.  No consuma ningn producto que contenga tabaco, lo que incluye cigarrillos, tabaco de mascar o cigarrillos electrnicos. Si necesita ayuda para dejar de fumar, consulte al mdico.  Si tiene sobrepeso, baje de peso y mantenga un peso saludable como se lo haya indicado el mdico.  Siga una dieta con bajo contenido de grasa y colesterol. Si necesita ayuda, consulte al mdico.  Haga ejercicios regularmente. Pdale al mdico que le sugiera algunas actividades que sean adecuadas para usted.  Cuide bien sus pies. ? Use calzado cmodo que calcen bien. ? Revise sus pies con frecuencia para ver si hay cortes o llagas.  SOLICITE AYUDA SI:  Tiene calambres en las  piernas cuando camina.  Le duelen las piernas al descansar.  Siente fro en una pierna o un pie.  Se producen cambios en la piel.  No puede tener una ereccin (disfuncin erctil).  Tiene cortes o llagas en los pies que no se curan.  SOLICITE AYUDA DE INMEDIATO SI:  Siente que la pierna o el brazo est fro o de color azul.  Tiene enrojecimiento, calor, inflamacin, dolor o adormecimiento en los brazos o las piernas.  Tiene dolor en el pecho o dificultad para respirar.  Siente en forma repentina debilidad en la cara, los brazos o las piernas.  Est muy confundido o no puede hablar.  Siente un dolor de cabeza muy intenso de manera repentina.  Pierde la visin en forma repentina.  Esta informacin no tiene como fin reemplazar el consejo del mdico. Asegrese de hacerle al mdico cualquier pregunta que tenga. Document Released: 08/05/2010 Document Revised: 07/24/2014 Document Reviewed: 12/11/2013 Elsevier Interactive Patient Education  2017 Elsevier Inc.  

## 2017-10-17 NOTE — Progress Notes (Signed)
Subjective:  Patient ID: Michele Wright, female    DOB: 1976/06/26  Age: 42 y.o. MRN: [id number]  CC: Medication refill  HPI Michele Wright is a 42 y.o. female with a medical history of HLD, asthma, seasonal allergies, plantar fasciitis presents as a new patient for an "annual exam". Feels well but is concerned of her family history of thyroid disease and diabetes. Patient would like a form filled out for [employer] to receive free Advair. Brings an incomplete form as there are five pages but only presents page 4 of 5. No current asthma attack or symptoms.    Complains of lower extremity edema after approximately 3-4 hours of walking. Has many varicosities. No LE pain bilaterally. Bought OTC compression stockings but finds the stockings were too tight and eventually ripped a stocking while putting it on. No other cardiovascular symptoms reported. Last LDL 111 six months ago. Has not taken statin as it was not prescribed.     Outpatient Medications Prior to Visit  Medication Sig Dispense Refill  . albuterol (PROVENTIL HFA;VENTOLIN HFA) 108 (90 Base) MCG/ACT inhaler Inhale 2 puffs into the lungs every 4 (four) hours as needed for wheezing. 3 Inhaler 6  . Fluticasone-Salmeterol (ADVAIR DISKUS) 500-50 MCG/DOSE AEPB Inhale 1 puff into the lungs 2 (two) times daily. 60 each 6  . montelukast (SINGULAIR) 10 MG tablet Take 1 tablet (10 mg total) by mouth at bedtime. (Patient not taking: Reported on 10/17/2017) 30 tablet 11  . Elastic Bandages & Supports (MEDICAL COMPRESSION STOCKINGS) MISC 1 each by Does not apply route daily. 30 mmHg 2 each 0  . ibuprofen (ADVIL,MOTRIN) 600 MG tablet Take 1 tablet (600 mg total) by mouth every 8 (eight) hours as needed. 30 tablet 1  . ketotifen (ALAWAY) 0.025 % ophthalmic solution Place 1 drop into both eyes 2 (two) times daily as needed. 5 mL 0   No facility-administered medications prior to visit.      ROS Review of Systems  Constitutional: Negative for chills,  fever and malaise/fatigue.  Eyes: Negative for blurred vision.  Respiratory: Negative for shortness of breath.   Cardiovascular: Negative for chest pain and palpitations.  Gastrointestinal: Negative for abdominal pain and nausea.  Genitourinary: Negative for dysuria and hematuria.  Musculoskeletal: Negative for joint pain and myalgias.  Skin: Negative for rash.  Neurological: Negative for tingling and headaches.  Psychiatric/Behavioral: Negative for depression. The patient is not nervous/anxious.     Objective:  BP 122/84 (BP Location: Right Arm, Patient Position: Sitting, Cuff Size: Large)   Pulse 61   Temp 98 F (36.7 C) (Oral)   Ht 5' 0.63" (1.54 m)   Wt 194 lb 12.8 oz (88.4 kg)   LMP 10/16/2017 (Exact Date)   SpO2 98%   BMI 37.26 kg/m   BP/Weight 10/17/2017 05/17/2017 01/11/2016  Systolic BP 122 102 118  Diastolic BP 84 63 77  Wt. (Lbs) 194.8 191.2 184.4  BMI 37.26 36.13 34.86      Physical Exam  Constitutional: She is oriented to person, place, and time.  Well developed, well nourished, NAD, polite  HENT:  Head: Normocephalic and atraumatic.  Eyes: No scleral icterus.  Neck: Normal range of motion. Neck supple. No thyromegaly present.  Cardiovascular: Normal rate, regular rhythm and normal heart sounds.  Multiple spider veins of the lower extremity bilaterally.   Pulmonary/Chest: Effort normal and breath sounds normal.  Musculoskeletal: She exhibits no edema.  Neurological: She is alert and oriented to person, place, and time. No cranial nerve  deficit. Coordination normal.  Skin: Skin is warm and dry. No rash noted. No erythema. No pallor.  Psychiatric: She has a normal mood and affect. Her behavior is normal. Thought content normal.  Vitals reviewed.    Assessment & Plan:    1. Venous (peripheral) insufficiency - Advised to use OTC compression stockings that are rated at a compression strength of 18 mmHg.  - treatment with dermatology and/or vascular  discussed with patient.  2. Elevated lipoprotein(a) - Lipid panel; Future - Comprehensive metabolic panel; Future  3. Moderate persistent asthma without complication - montelukast (SINGULAIR) 10 MG tablet; Take 1 tablet (10 mg total) by mouth at bedtime.  Dispense: 30 tablet; Refill: 11 - Fluticasone-Salmeterol (ADVAIR DISKUS) 500-50 MCG/DOSE AEPB; Inhale 1 puff into the lungs 2 (two) times daily.  Dispense: 60 each; Refill: 6 - albuterol (PROVENTIL HFA;VENTOLIN HFA) 108 (90 Base) MCG/ACT inhaler; Inhale 2 puffs into the lungs every 4 (four) hours as needed for wheezing.  Dispense: 3 Inhaler; Refill: 6 - Pt advised to speak with [facility] pharmacy about completing PASS program to receive Advair at a reduced price or for free.   4. Seasonal allergies - montelukast (SINGULAIR) 10 MG tablet; Take 1 tablet (10 mg total) by mouth at bedtime.  Dispense: 30 tablet; Refill: 11  5. Screening for diabetes mellitus - HgB A1c 5.9%   Meds ordered this encounter  Medications  . montelukast (SINGULAIR) 10 MG tablet    Sig: Take 1 tablet (10 mg total) by mouth at bedtime.    Dispense:  30 tablet    Refill:  11    Please talk to patient about PASS program.    Order Specific Question:   Supervising Provider    Answer:   Quentin AngstJEGEDE, OLUGBEMIGA E [[id number]]  . Fluticasone-Salmeterol (ADVAIR DISKUS) 500-50 MCG/DOSE AEPB    Sig: Inhale 1 puff into the lungs 2 (two) times daily.    Dispense:  60 each    Refill:  6    Please talk to patient about PASS program.    Order Specific Question:   Supervising Provider    Answer:   Quentin AngstJEGEDE, OLUGBEMIGA E [[id number]]  . albuterol (PROVENTIL HFA;VENTOLIN HFA) 108 (90 Base) MCG/ACT inhaler    Sig: Inhale 2 puffs into the lungs every 4 (four) hours as needed for wheezing.    Dispense:  3 Inhaler    Refill:  6    Please talk to patient about PASS program.    Order Specific Question:   Supervising Provider    Answer:   Quentin AngstJEGEDE, OLUGBEMIGA E [[id number]]    Follow-up: Return in  about 2 weeks (around 10/31/2017) for pap.   Loletta Specteroger David Teryl Mcconaghy PA

## 2017-10-31 ENCOUNTER — Encounter (INDEPENDENT_AMBULATORY_CARE_PROVIDER_SITE_OTHER): Payer: Self-pay | Admitting: Nurse Practitioner

## 2017-10-31 ENCOUNTER — Other Ambulatory Visit: Payer: Self-pay

## 2017-10-31 ENCOUNTER — Ambulatory Visit (INDEPENDENT_AMBULATORY_CARE_PROVIDER_SITE_OTHER): Payer: Self-pay | Admitting: Nurse Practitioner

## 2017-10-31 ENCOUNTER — Other Ambulatory Visit (HOSPITAL_COMMUNITY)
Admission: RE | Admit: 2017-10-31 | Discharge: 2017-10-31 | Disposition: A | Discharge status: Home / Self Care | Payer: Self-pay | Source: Ambulatory Visit | Attending: Nurse Practitioner | Admitting: Nurse Practitioner

## 2017-10-31 VITALS — BP 126/84 | HR 68 | Temp 98.0°F | Ht 61.0 in | Wt 191.2 lb

## 2017-10-31 DIAGNOSIS — E7841 Elevated Lipoprotein(a): Secondary | ICD-10-CM

## 2017-10-31 DIAGNOSIS — Z01419 Encounter for gynecological examination (general) (routine) without abnormal findings: Secondary | ICD-10-CM

## 2017-10-31 NOTE — Progress Notes (Signed)
Assessment & Plan:  Michele Wright was seen today for gynecologic exam.  Diagnoses and all orders for this visit:  Encounter for well woman exam with routine gynecological exam -     Cytology - PAP -     Cervicovaginal ancillary only    Patient has been counseled on age-appropriate routine health concerns for screening and prevention. These are reviewed and up-to-date. Referrals have been placed accordingly. Immunizations are up-to-date or declined.    Subjective:   Chief Complaint  Patient presents with  . Gynecologic Exam   HPI Michele Wright 42 y.o. female presents to office today for PAP smear.   Review of Systems  Constitutional: Negative.  Negative for chills, fever, malaise/fatigue and weight loss.  Respiratory: Negative.  Negative for cough, shortness of breath and wheezing.   Cardiovascular: Negative.  Negative for chest pain, orthopnea and leg swelling.  Gastrointestinal: Negative for abdominal pain.  Genitourinary: Negative for flank pain.  Skin: Negative.  Negative for rash.  Psychiatric/Behavioral: Negative for suicidal ideas.    Past Medical History:  Diagnosis Date  . Asthma     No past surgical history on file.  Family History  Problem Relation Age of Onset  . Breast cancer Sister     Social History Reviewed with no changes to be made today.   Outpatient Medications Prior to Visit  Medication Sig Dispense Refill  . albuterol (PROVENTIL HFA;VENTOLIN HFA) 108 (90 Base) MCG/ACT inhaler Inhale 2 puffs into the lungs every 4 (four) hours as needed for wheezing. 3 Inhaler 6  . Fluticasone-Salmeterol (ADVAIR DISKUS) 500-50 MCG/DOSE AEPB Inhale 1 puff into the lungs 2 (two) times daily. 60 each 6  . montelukast (SINGULAIR) 10 MG tablet Take 1 tablet (10 mg total) by mouth at bedtime. 30 tablet 11   No facility-administered medications prior to visit.     No Known Allergies     Objective:    BP 126/84 (BP Location: Right Arm, Patient Position: Sitting,  Cuff Size: Large)   Pulse 68   Temp 98 F (36.7 C) (Oral)   Ht 5\' 1"  (1.549 m)   Wt 191 lb 3.2 oz (86.7 kg)   LMP 10/16/2017 (Exact Date)   SpO2 97%   BMI 36.13 kg/m  Wt Readings from Last 3 Encounters:  10/31/17 191 lb 3.2 oz (86.7 kg)  10/17/17 194 lb 12.8 oz (88.4 kg)  05/17/17 191 lb 3.2 oz (86.7 kg)    Physical Exam  Constitutional: She is oriented to person, place, and time. She appears well-developed and well-nourished.  HENT:  Head: Normocephalic.  Cardiovascular: Normal rate, regular rhythm and normal heart sounds.  Pulmonary/Chest: Effort normal and breath sounds normal. Right breast exhibits no inverted nipple, no mass, no nipple discharge, no skin change and no tenderness. Left breast exhibits no inverted nipple, no mass, no nipple discharge, no skin change and no tenderness.  Abdominal: Soft. Bowel sounds are normal. Hernia confirmed negative in the right inguinal area and confirmed negative in the left inguinal area.  Genitourinary: Rectal exam shows no external hemorrhoid. No labial fusion. There is no rash, tenderness, lesion or injury on the right labia. There is no rash, tenderness, lesion or injury on the left labia. Uterus is not deviated, not enlarged, not fixed and not tender. Cervix exhibits discharge. Cervix exhibits no motion tenderness and no friability. Right adnexum displays no mass, no tenderness and no fullness. Left adnexum displays no mass, no tenderness and no fullness. There is bleeding in the vagina. No  erythema or tenderness in the vagina. No foreign body in the vagina. No signs of injury around the vagina. Vaginal discharge found.  Lymphadenopathy:       Right: No inguinal adenopathy present.       Left: No inguinal adenopathy present.  Neurological: She is alert and oriented to person, place, and time.  Skin: Skin is warm and dry.  Psychiatric: She has a normal mood and affect. Her speech is normal and behavior is normal. Judgment and thought content  normal. Cognition and memory are normal.         Patient has been counseled extensively about nutrition and exercise as well as the importance of adherence with medications and regular follow-up. The patient was given clear instructions to go to ER or return to medical center if symptoms don't improve, worsen or new problems develop. The patient verbalized understanding.   Follow-up: No follow-ups on file.   Claiborne RiggZelda W Fleming, FNP-BC [facility] [place], [place] [phone]   10/31/2017, 5:06 PM

## 2017-11-02 LAB — CYTOLOGY - PAP: DIAGNOSIS: NEGATIVE

## 2017-11-02 LAB — CERVICOVAGINAL ANCILLARY ONLY
Bacterial vaginitis: NEGATIVE
Candida vaginitis: NEGATIVE
Chlamydia: NEGATIVE
NEISSERIA GONORRHEA: NEGATIVE
Trichomonas: NEGATIVE

## 2017-11-05 ENCOUNTER — Telehealth (INDEPENDENT_AMBULATORY_CARE_PROVIDER_SITE_OTHER): Payer: Self-pay

## 2017-11-05 NOTE — Telephone Encounter (Signed)
Patient aware of normal pap and not needing another pap until 2022. Maryjean Mornempestt S Roberts, CMA   Patient informed using interpreter [phone] with pacific interpreters.

## 2017-11-05 NOTE — Telephone Encounter (Signed)
-----   Message from Claiborne RiggZelda W Fleming, NP sent at 11/04/2017 11:26 PM EDT ----- PAP smear is normal. Your next PAP is due 2022

## 2017-11-06 ENCOUNTER — Telehealth: Payer: Self-pay

## 2017-11-06 NOTE — Telephone Encounter (Signed)
-----   Message from Claiborne RiggZelda W Fleming, NP sent at 11/01/2017  5:03 PM EDT ----- Lab results negative for any vaginal infection

## 2017-11-06 NOTE — Telephone Encounter (Signed)
CMA called patient to inform on lab results.   Patient understood. Patient verified DOB. Spanish interpreter Darien Ramusna [phone] assist with the call.

## 2017-11-12 ENCOUNTER — Other Ambulatory Visit (INDEPENDENT_AMBULATORY_CARE_PROVIDER_SITE_OTHER): Payer: Self-pay

## 2017-11-12 NOTE — Addendum Note (Signed)
Addended by: Maryjean Morn on: 11/12/2017 09:34 AM   Modules accepted: Orders

## 2017-11-13 ENCOUNTER — Other Ambulatory Visit (INDEPENDENT_AMBULATORY_CARE_PROVIDER_SITE_OTHER): Payer: Self-pay | Admitting: Physician Assistant

## 2017-11-13 DIAGNOSIS — E7841 Elevated Lipoprotein(a): Secondary | ICD-10-CM

## 2017-11-13 LAB — COMPREHENSIVE METABOLIC PANEL
A/G RATIO: 1.5 (ref 1.2–2.2)
ALT: 22 IU/L (ref 0–32)
AST: 16 IU/L (ref 0–40)
Albumin: 3.8 g/dL (ref 3.5–5.5)
Alkaline Phosphatase: 89 IU/L (ref 39–117)
BUN/Creatinine Ratio: 19 (ref 9–23)
BUN: 12 mg/dL (ref 6–24)
Bilirubin Total: 0.2 mg/dL (ref 0.0–1.2)
CO2: 21 mmol/L (ref 20–29)
CREATININE: 0.64 mg/dL (ref 0.57–1.00)
Calcium: 8.9 mg/dL (ref 8.7–10.2)
Chloride: 106 mmol/L (ref 96–106)
GFR, EST AFRICAN AMERICAN: 126 mL/min/{1.73_m2} (ref >59)
GFR, EST NON AFRICAN AMERICAN: 110 mL/min/{1.73_m2} (ref >59)
GLUCOSE: 99 mg/dL (ref 65–99)
Globulin, Total: 2.6 g/dL (ref 1.5–4.5)
Potassium: 4.2 mmol/L (ref 3.5–5.2)
Sodium: 142 mmol/L (ref 134–144)
TOTAL PROTEIN: 6.4 g/dL (ref 6.0–8.5)

## 2017-11-13 LAB — LIPID PANEL
CHOL/HDL RATIO: 3.7 ratio (ref 0.0–4.4)
Cholesterol, Total: 171 mg/dL (ref 100–199)
HDL: 46 mg/dL (ref >39)
LDL CALC: 101 mg/dL — AB (ref 0–99)
TRIGLYCERIDES: 119 mg/dL (ref 0–149)
VLDL Cholesterol Cal: 24 mg/dL (ref 5–40)

## 2017-11-13 MED ORDER — ATORVASTATIN CALCIUM 20 MG PO TABS: 20.0000 mg | ORAL_TABLET | Freq: Every day | ORAL | 3 refills | Status: DC

## 2017-11-13 MED FILL — ?ATORVASTATIN 20 MG TABLET: 20 | 30 days supply | Qty: 30 | Fill #0

## 2017-11-14 ENCOUNTER — Telehealth (INDEPENDENT_AMBULATORY_CARE_PROVIDER_SITE_OTHER): Payer: Self-pay

## 2017-11-14 NOTE — Telephone Encounter (Signed)
Using [phone]) with pacific interpreters patient is aware of normal labs except for mildly elevated cholesterol and medication being sent to [facility] pharmacy.  Maryjean Morn, CMA

## 2017-11-14 NOTE — Telephone Encounter (Signed)
-----   Message from Loletta Specter, PA-C sent at 11/13/2017  8:43 AM EDT ----- Normal results except for mildly elevated cholesterol. I have sent medication to [facility].

## 2017-11-20 ENCOUNTER — Telehealth (INDEPENDENT_AMBULATORY_CARE_PROVIDER_SITE_OTHER): Payer: Self-pay | Admitting: Physician Assistant

## 2017-11-20 NOTE — Telephone Encounter (Signed)
Pt came in to request advice on her bills she keeps receiving of the amount of $300.15 to account number [id number], and a [facility] bill of the amount $68.20 to account number [id number].Please follow up with patient, and a copy was sent to [facility]. Eligibility termination date:11/26/2017

## 2017-12-04 ENCOUNTER — Ambulatory Visit
Admission: RE | Admit: 2017-12-04 | Discharge: 2017-12-04 | Disposition: A | Discharge status: Home / Self Care | Payer: Self-pay | Source: Ambulatory Visit | Attending: Family Medicine | Admitting: Family Medicine

## 2017-12-04 DIAGNOSIS — Z1231 Encounter for screening mammogram for malignant neoplasm of breast: Secondary | ICD-10-CM

## 2017-12-12 ENCOUNTER — Ambulatory Visit: Payer: Self-pay | Attending: Physician Assistant

## 2017-12-14 MED FILL — ATORVASTATIN 20 MG TABLET: 20 | 30 days supply | Qty: 30 | Fill #1

## 2018-01-08 ENCOUNTER — Other Ambulatory Visit (INDEPENDENT_AMBULATORY_CARE_PROVIDER_SITE_OTHER): Payer: Self-pay | Admitting: Physician Assistant

## 2018-01-08 DIAGNOSIS — J4540 Moderate persistent asthma, uncomplicated: Secondary | ICD-10-CM

## 2018-01-09 ENCOUNTER — Encounter: Payer: Self-pay | Admitting: Pharmacy Technician

## 2018-01-09 ENCOUNTER — Ambulatory Visit (INDEPENDENT_AMBULATORY_CARE_PROVIDER_SITE_OTHER): Payer: Self-pay | Admitting: Physician Assistant

## 2018-01-09 ENCOUNTER — Encounter (INDEPENDENT_AMBULATORY_CARE_PROVIDER_SITE_OTHER): Payer: Self-pay | Admitting: Physician Assistant

## 2018-01-09 VITALS — BP 107/68 | HR 67 | Temp 98.2°F | Ht 61.0 in | Wt 187.4 lb

## 2018-01-09 DIAGNOSIS — Z760 Encounter for issue of repeat prescription: Secondary | ICD-10-CM

## 2018-01-09 DIAGNOSIS — G8929 Other chronic pain: Secondary | ICD-10-CM

## 2018-01-09 DIAGNOSIS — M25562 Pain in left knee: Secondary | ICD-10-CM

## 2018-01-09 DIAGNOSIS — M25561 Pain in right knee: Secondary | ICD-10-CM

## 2018-01-09 MED ORDER — ALBUTEROL SULFATE HFA 108 (90 BASE) MCG/ACT IN AERS: 2.0000 | INHALATION_SPRAY | RESPIRATORY_TRACT | 6 refills | Status: DC | PRN

## 2018-01-09 MED ORDER — NAPROXEN 500 MG PO TABS: 500.0000 mg | ORAL_TABLET | Freq: Two times a day (BID) | ORAL | 2 refills | Status: DC

## 2018-01-09 MED ORDER — ACETAMINOPHEN-CODEINE #3 300-30 MG PO TABS: 1.0000 | ORAL_TABLET | ORAL | 0 refills | Status: AC | PRN

## 2018-01-09 MED ORDER — MONTELUKAST SODIUM 10 MG PO TABS: 10.0000 mg | ORAL_TABLET | Freq: Every day | ORAL | 11 refills | Status: DC

## 2018-01-09 MED ORDER — FLUTICASONE-SALMETEROL 500-50 MCG/DOSE IN AEPB: 1.0000 | INHALATION_SPRAY | Freq: Two times a day (BID) | RESPIRATORY_TRACT | 6 refills | Status: DC

## 2018-01-09 MED FILL — NAPROXEN 500 MG TABLET: 500 | 15 days supply | Qty: 30 | Fill #0

## 2018-01-09 MED FILL — $VENTOLIN HFA 18G INHALER: 108 (90 BAS | 48 days supply | Qty: 54 | Fill #0

## 2018-01-09 MED FILL — $ADVAIR 500/50MCG INHALER: 500-50 | 90 days supply | Qty: 180 | Fill #0

## 2018-01-09 MED FILL — MONTELUKAST SOD 10 MG TAB: 10 | 30 days supply | Qty: 30 | Fill #0

## 2018-01-09 NOTE — Patient Instructions (Signed)
Dolor de rodilla  (Knee Pain)  El dolor de rodilla es un problema frecuente y puede tener muchas causas. A menudo desaparece si se siguen las instrucciones del médico para el cuidado en el hogar. El tratamiento del dolor continuo dependerá de su causa. Si el dolor persiste, tal vez haya que realizar más estudios para diagnosticar la afección, los cuales pueden incluir radiografías u otros estudios de diagnóstico por imágenes de la rodilla.  CUIDADOS EN EL HOGAR  · Tome los medicamentos solamente como se lo haya indicado el médico.  · Mantenga la rodilla en reposo y en alto (elevada) mientras está descansando.  · No haga cosas que le causen dolor o que lo intensifiquen.  · Evite las actividades en las que ambos pies se separan del suelo al mismo tiempo, por ejemplo, correr, saltar la soga o hacer saltos de tijera.  · Aplique hielo sobre la zona de la rodilla:  ? Ponga el hielo en una bolsa plástica.  ? Coloque una toalla entre la piel y la bolsa de hielo.  ? Coloque el hielo durante 20 minutos, 2 a 3 veces por día.  · Pregúntele al médico si debe usar una rodillera elástica.  · Duerma con una almohada debajo de la rodilla.  · Baje de peso si es necesario. El sobrepeso puede aumentar el dolor de rodilla.  · No consuma ningún producto que contenga tabaco, lo que incluye cigarrillos, tabaco de mascar o cigarrillos electrónicos. Si necesita ayuda para dejar de fumar, consulte al médico. Fumar puede retrasar la curación de cualquier problema que tenga en el hueso y la articulación.    SOLICITE AYUDA SI:  · El dolor de rodilla no desaparece, cambia o empeora.  · Tiene fiebre junto con dolor de rodilla.  · La rodilla le falla o se le queda trabada.  · La rodilla está más hinchada.    SOLICITE AYUDA DE INMEDIATO SI:  · La rodilla está caliente al tacto.  · Tiene dolor en el pecho o dificultad para respirar.    Esta información no tiene como fin reemplazar el consejo del médico.  Asegúrese de hacerle al médico cualquier pregunta que tenga.  Document Released: 01/28/2014 Document Revised: 01/28/2014 Document Reviewed: 09/03/2013  Elsevier Interactive Patient Education © 2017 Elsevier Inc.

## 2018-01-09 NOTE — Progress Notes (Signed)
Roger,  This patient has met with me previously to sign paperwork for patient assistance for her inhalers.She is approved to receive Advair and Ventolin up to once calendar year at no cost. We have a 90 day supply of Advair and 3 ventolin inhalers ready for pick up at the pharmacy at her convenience.  Thanks, Theatre manager.

## 2018-01-09 NOTE — Progress Notes (Signed)
Subjective:  Patient ID: Michele Wright, female    DOB: 05/04/1976  Age: 42 y.o. MRN: [id number]  CC: Asthma, knee pain  HPI Michele Wright is a 42 y.o. female with a medical history of HLD, asthma, seasonal allergies, plantar fasciitis presents for management of asthma and bilateral knee pain. MR right knee 01/2016 revealed significant medial compartment degenerative changes with areas of full or near full-thickness cartilage loss, joint space narrowing, osteophytic spurring and moderate subchondral edema in the medial femoral condyles. Mild degenerative changes involving the medial meniscus. Mild mucoid degeneration of the ACL. Small joint effusion and mild synovitis. Mild MCL and pes anserine bursitis. Both knees are hurting with pain on right knee greater than left knee. Difficult to walk or stand for prolonged periods.     Would like a refill of her asthmatic medications to include inhalers. Ran out of her inhalers and endorses mild wheezing without asthmatic exacerbation.      Outpatient Medications Prior to Visit  Medication Sig Dispense Refill  . albuterol (PROVENTIL HFA;VENTOLIN HFA) 108 (90 Base) MCG/ACT inhaler Inhale 2 puffs into the lungs every 4 (four) hours as needed for wheezing. 3 Inhaler 6  . atorvastatin (LIPITOR) 20 MG tablet Take 1 tablet (20 mg total) by mouth daily. 90 tablet 3  . Fluticasone-Salmeterol (ADVAIR DISKUS) 500-50 MCG/DOSE AEPB Inhale 1 puff into the lungs 2 (two) times daily. 60 each 6  . montelukast (SINGULAIR) 10 MG tablet Take 1 tablet (10 mg total) by mouth at bedtime. 30 tablet 11   No facility-administered medications prior to visit.      ROS Review of Systems  Constitutional: Negative for chills, fever and malaise/fatigue.  Eyes: Negative for blurred vision.  Respiratory: Negative for shortness of breath.   Cardiovascular: Negative for chest pain and palpitations.  Gastrointestinal: Negative for abdominal pain and nausea.   Genitourinary: Negative for dysuria and hematuria.  Musculoskeletal: Positive for joint pain. Negative for myalgias.  Skin: Negative for rash.  Neurological: Negative for tingling and headaches.  Psychiatric/Behavioral: Negative for depression. The patient is not nervous/anxious.     Objective:  BP 107/68 (BP Location: Left Arm, Patient Position: Sitting, Cuff Size: Normal)   Pulse 67   Temp 98.2 F (36.8 C) (Oral)   Ht 5\' 1"  (1.549 m)   Wt 187 lb 6.4 oz (85 kg)   LMP 12/30/2017 (Exact Date)   SpO2 95%   BMI 35.41 kg/m   BP/Weight 01/09/2018 10/31/2017 10/17/2017  Systolic BP 107 126 122  Diastolic BP 68 84 84  Wt. (Lbs) 187.4 191.2 194.8  BMI 35.41 36.13 37.26      Physical Exam  Constitutional: She is oriented to person, place, and time.  Well developed, overweight, NAD, polite  HENT:  Head: Normocephalic and atraumatic.  Eyes: No scleral icterus.  Neck: Normal range of motion. Neck supple. No thyromegaly present.  Cardiovascular: Normal rate, regular rhythm and normal heart sounds.  Pulmonary/Chest: Effort normal. She has wheezes (mild and diffuse).  Musculoskeletal: She exhibits no edema.  Mildly limited flexion and extension of the right knee. Mild TTP in the medial aspect of the right knee. Mild TTP along the tibial plateau of the left knee. Full aROM of left knee.   Neurological: She is alert and oriented to person, place, and time.  Skin: Skin is warm and dry. No rash noted. No erythema. No pallor.  Psychiatric: She has a normal mood and affect. Her behavior is normal. Thought content normal.  Vitals reviewed.  Assessment & Plan:    1. Chronic pain of both knees - AMB referral to orthopedics - Begin acetaminophen-codeine (TYLENOL #3) 300-30 MG tablet; Take 1 tablet by mouth every 4 (four) hours as needed for up to 7 days for moderate pain.  Dispense: 28 tablet; Refill: 0 - Begin naproxen (NAPROSYN) 500 MG tablet; Take 1 tablet (500 mg total) by mouth 2  (two) times daily with a meal.  Dispense: 30 tablet; Refill: 2 - DG Knee Complete 4 Views Left; Future  2. Medication refill - albuterol (PROVENTIL HFA;VENTOLIN HFA) 108 (90 Base) MCG/ACT inhaler; Inhale 2 puffs into the lungs every 4 (four) hours as needed for wheezing.  Dispense: 3 Inhaler; Refill: 6 - Fluticasone-Salmeterol (ADVAIR DISKUS) 500-50 MCG/DOSE AEPB; Inhale 1 puff into the lungs 2 (two) times daily.  Dispense: 60 each; Refill: 6 - montelukast (SINGULAIR) 10 MG tablet; Take 1 tablet (10 mg total) by mouth at bedtime.  Dispense: 30 tablet; Refill: 11   Meds ordered this encounter  Medications  . albuterol (PROVENTIL HFA;VENTOLIN HFA) 108 (90 Base) MCG/ACT inhaler    Sig: Inhale 2 puffs into the lungs every 4 (four) hours as needed for wheezing.    Dispense:  3 Inhaler    Refill:  6    Please talk to patient about PASS program.    Order Specific Question:   Supervising Provider    Answer:   Hoy RegisterNEWLIN, ENOBONG [[id number]]  . Fluticasone-Salmeterol (ADVAIR DISKUS) 500-50 MCG/DOSE AEPB    Sig: Inhale 1 puff into the lungs 2 (two) times daily.    Dispense:  60 each    Refill:  6    Please talk to patient about PASS program.    Order Specific Question:   Supervising Provider    Answer:   Hoy RegisterNEWLIN, ENOBONG [[id number]]  . montelukast (SINGULAIR) 10 MG tablet    Sig: Take 1 tablet (10 mg total) by mouth at bedtime.    Dispense:  30 tablet    Refill:  11    Please talk to patient about PASS program.    Order Specific Question:   Supervising Provider    Answer:   Hoy RegisterNEWLIN, ENOBONG [[id number]]  . acetaminophen-codeine (TYLENOL #3) 300-30 MG tablet    Sig: Take 1 tablet by mouth every 4 (four) hours as needed for up to 7 days for moderate pain.    Dispense:  28 tablet    Refill:  0    Order Specific Question:   Supervising Provider    Answer:   Hoy RegisterNEWLIN, ENOBONG [[id number]]  . naproxen (NAPROSYN) 500 MG tablet    Sig: Take 1 tablet (500 mg total) by mouth 2 (two) times daily with a meal.    Dispense:  30  tablet    Refill:  2    Order Specific Question:   Supervising Provider    Answer:   Hoy RegisterNEWLIN, ENOBONG [[id number]]    Follow-up: Return in about 6 weeks (around 02/20/2018) for Knee pain.   Loletta Specteroger David Gomez PA

## 2018-01-11 MED FILL — ACETAMINOPHEN/COD #3 TABLET: 300-30 | 4 days supply | Qty: 28 | Fill #0

## 2018-01-22 MED FILL — ?ATORVASTATIN 20 MG TABLET: 20 | 30 days supply | Qty: 30 | Fill #2

## 2018-01-23 ENCOUNTER — Ambulatory Visit (INDEPENDENT_AMBULATORY_CARE_PROVIDER_SITE_OTHER): Payer: Self-pay | Admitting: Orthopedic Surgery

## 2018-01-23 ENCOUNTER — Encounter (INDEPENDENT_AMBULATORY_CARE_PROVIDER_SITE_OTHER): Payer: Self-pay | Admitting: Orthopedic Surgery

## 2018-01-23 ENCOUNTER — Ambulatory Visit (INDEPENDENT_AMBULATORY_CARE_PROVIDER_SITE_OTHER): Payer: Self-pay

## 2018-01-23 DIAGNOSIS — M545 Low back pain: Secondary | ICD-10-CM

## 2018-01-23 DIAGNOSIS — M79605 Pain in left leg: Secondary | ICD-10-CM

## 2018-01-23 DIAGNOSIS — M79604 Pain in right leg: Secondary | ICD-10-CM

## 2018-01-23 DIAGNOSIS — M1712 Unilateral primary osteoarthritis, left knee: Secondary | ICD-10-CM

## 2018-01-23 DIAGNOSIS — M1711 Unilateral primary osteoarthritis, right knee: Secondary | ICD-10-CM

## 2018-01-27 ENCOUNTER — Encounter (INDEPENDENT_AMBULATORY_CARE_PROVIDER_SITE_OTHER): Payer: Self-pay | Admitting: Orthopedic Surgery

## 2018-01-27 DIAGNOSIS — M1712 Unilateral primary osteoarthritis, left knee: Secondary | ICD-10-CM

## 2018-01-27 MED ORDER — METHYLPREDNISOLONE ACETATE 40 MG/ML IJ SUSP
40.0000 mg | INTRAMUSCULAR | Status: AC | PRN
  Administered 2018-01-27: 40 mg via INTRA_ARTICULAR

## 2018-01-27 MED ORDER — LIDOCAINE HCL 1 % IJ SOLN
5.0000 mL | INTRAMUSCULAR | Status: AC | PRN
  Administered 2018-01-27: 5 mL

## 2018-01-27 MED ORDER — BUPIVACAINE HCL 0.25 % IJ SOLN
4.0000 mL | INTRAMUSCULAR | Status: AC | PRN
  Administered 2018-01-27: 4 mL via INTRA_ARTICULAR

## 2018-01-27 NOTE — Progress Notes (Addendum)
Office Visit Note   Patient: Michele Wright           Date of Birth: Dec 11, 1975           MRN: [id number] Visit Date: 01/23/2018 Requested by: Loletta SpecterGomez, Roger David, PA-C [address] [place], [place] [id number] PCP: Denny LevyGomez, Roger David, PA-C  Subjective: Chief Complaint  Patient presents with  . Right Knee - Pain  . Left Knee - Pain  . Lower Back - Pain    HPI: Patient presents for evaluation of low back pain and bilateral knee pain.  Denies any history of injury to the back or knees.  States she has had pain in those knees for 6 weeks with the left being worse than the right.  She states that the pain is constant.  She reports swelling in the left knee along with weakness and giving way.  She also reports some low back pain with radiation down into the legs.  Previously she was scheduled for right knee surgery but now her left knee hurts worse.  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              ROS: All systems reviewed are negative as they relate to the chief complaint within the history of present illness.  Patient denies  fevers or chills.   Assessment & Plan: Visit Diagnoses:  1. Bilateral leg pain   2. Low back pain, unspecified back pain laterality, unspecified chronicity, with sciatica presence unspecified   3. Unilateral primary osteoarthritis, left knee   4. Unilateral primary osteoarthritis, right knee     Plan: Impression is low back pain which seems mechanical in nature with no nerve root tension signs and no paresthesias.  This is something that we can do some stretching and try conservative measures 4.  In regards to the knees she has left knee medial compartment arthritis with about 2 mm of joint space.  The right knee has more arthritis but it hurts less than the left-hand side.  She is taking some type of over[place]the[place] drug from [place] which sounds like it is an anti-inflammatory.  Entire clinic visit done through an interpreter today.  Plan  aspirate and inject the left knee today and follow-up with me as needed in 4 months for consideration of repeat injection.  Follow-Up Instructions: Return in about 4 months (around 05/26/2018).   Orders:  Orders Placed This Encounter  Procedures  . XR Knee 1-2 Views Right  . XR KNEE 3 VIEW LEFT  . XR Lumbar Spine 2-3 Views   No orders of the defined types were placed in this encounter.     Procedures: Large Joint Inj: L knee on 01/27/2018 9:34 PM Indications: diagnostic evaluation, joint swelling and pain Details: 18 G 1.5 in needle, superolateral approach  Arthrogram: No  Medications: 5 mL lidocaine 1 %; 40 mg methylPREDNISolone acetate 40 MG/ML; 4 mL bupivacaine 0.25 % Outcome: tolerated well, no immediate complications Procedure, treatment alternatives, risks and benefits explained, specific risks discussed. Consent was given by the patient. Immediately prior to procedure a time out was called to verify the correct patient, procedure, equipment, support staff and site/side marked as required. Patient was prepped and draped in the usual sterile fashion.       Clinical Data: No additional findings.  Objective: Vital Signs: LMP 12/30/2017 (Exact Date)   Physical Exam:   Constitutional: Patient appears well-developed HEENT:  Head: Normocephalic Eyes:EOM are normal Neck: Normal range of motion Cardiovascular:  Normal rate Pulmonary/chest: Effort normal Neurologic: Patient is alert Skin: Skin is warm Psychiatric: Patient has normal mood and affect    Ortho Exam: Orthopedic exam demonstrates normal gait alignment.  Mild effusion in the left knee no effusion in the right knee.  Pedal pulses palpable.  Alignment intact.  No groin pain with internal/external rotation of the leg.  Range of motion both knees is full.  No flexion contracture in the left knee or right knee.  Patient has medial greater than lateral joint line tenderness on the left.  Collateral and cruciate  ligaments are stable.  No other masses lymphadenopathy or skin changes noted in the left knee region.  Specialty Comments:  No specialty comments available.  Imaging: No results found.   PMFS History: Patient Active Problem List   Diagnosis Date Noted  . Chronic pain of both knees 01/09/2018  . Right medial knee pain 11/11/2015  . Peripheral edema 11/11/2015  . Heel pain, bilateral 01/14/2014  . Asthma, chronic 04/03/2013   Past Medical History:  Diagnosis Date  . Asthma     Family History  Problem Relation Age of Onset  . Breast cancer Sister     History reviewed. No pertinent surgical history. Social History   Occupational History  . Not on file  Tobacco Use  . Smoking status: Never Smoker  . Smokeless tobacco: Never Used  Substance and Sexual Activity  . Alcohol use: Yes    Comment: occasionally  . Drug use: No  . Sexual activity: Not on file

## 2018-02-20 ENCOUNTER — Encounter (INDEPENDENT_AMBULATORY_CARE_PROVIDER_SITE_OTHER): Payer: Self-pay | Admitting: Physician Assistant

## 2018-02-20 ENCOUNTER — Ambulatory Visit (INDEPENDENT_AMBULATORY_CARE_PROVIDER_SITE_OTHER): Payer: Self-pay | Admitting: Physician Assistant

## 2018-02-20 ENCOUNTER — Other Ambulatory Visit: Payer: Self-pay

## 2018-02-20 VITALS — BP 119/74 | HR 69 | Temp 97.8°F | Ht 61.0 in | Wt 180.6 lb

## 2018-02-20 DIAGNOSIS — M25561 Pain in right knee: Secondary | ICD-10-CM

## 2018-02-20 DIAGNOSIS — M25562 Pain in left knee: Secondary | ICD-10-CM

## 2018-02-20 DIAGNOSIS — M170 Bilateral primary osteoarthritis of knee: Secondary | ICD-10-CM

## 2018-02-20 DIAGNOSIS — G8929 Other chronic pain: Secondary | ICD-10-CM

## 2018-02-20 MED ORDER — NAPROXEN 500 MG PO TABS: 500.0000 mg | ORAL_TABLET | Freq: Two times a day (BID) | ORAL | 1 refills | Status: DC

## 2018-02-20 MED ORDER — ACETAMINOPHEN-CODEINE #3 300-30 MG PO TABS: 1.0000 | ORAL_TABLET | Freq: Two times a day (BID) | ORAL | 0 refills | Status: DC | PRN

## 2018-02-20 MED FILL — NAPROXEN 500 MG TABLET: 500 | 30 days supply | Qty: 60 | Fill #0

## 2018-02-20 MED FILL — ACETAMINOPHEN/COD #3 TABLET: 300-30 | 30 days supply | Qty: 60 | Fill #0

## 2018-02-20 NOTE — Progress Notes (Signed)
Subjective:  Patient ID: Michele Wright, female    DOB: 1976-01-29  Age: 42 y.o. MRN: [id number]  CC: f/u knee pain  HPI Michele Wright a 42 y.o.femalewith a medical history of HLD, asthma, seasonal allergies, and plantar fasciitis presents on f/u of bilateral knee pain.  MR right knee 01/2016 revealed significant medial compartment degenerative changes with areas of full or near full-thickness cartilage loss, joint space narrowing, osteophytic spurring and moderate subchondral edema in the medial femoral condyles. Mild degenerative changes involving the medial meniscus. Mild mucoid degeneration of the ACL. Small joint effusion and mild synovitis. Mild MCL and pes anserine bursitis. Both knees are hurting with pain on right knee greater than left knee. Difficult to walk or stand for prolonged periods. Referred to orthopedics and seen on 01/09/18 by Dr. Rise Paganini. He diagnosed primary osteoarthritis of the knee bilaterally. Pt had left knee aspirated due to effusion and injected with steroid. Repeat injection in 4 months  Says her left knee is mildly better and current pain is rated 6/10. She is riding bicycle for exercise and for better knee health. Does not endorse any other symptoms or complaints.       Outpatient Medications Prior to Visit  Medication Sig Dispense Refill  . albuterol (PROVENTIL HFA;VENTOLIN HFA) 108 (90 Base) MCG/ACT inhaler Inhale 2 puffs into the lungs every 4 (four) hours as needed for wheezing. 3 Inhaler 6  . atorvastatin (LIPITOR) 20 MG tablet Take 1 tablet (20 mg total) by mouth daily. 90 tablet 3  . Fluticasone-Salmeterol (ADVAIR DISKUS) 500-50 MCG/DOSE AEPB Inhale 1 puff into the lungs 2 (two) times daily. 60 each 6  . montelukast (SINGULAIR) 10 MG tablet Take 1 tablet (10 mg total) by mouth at bedtime. 30 tablet 11  . naproxen (NAPROSYN) 500 MG tablet Take 1 tablet (500 mg total) by mouth 2 (two) times daily with a meal. 30 tablet 2   No  facility-administered medications prior to visit.      ROS Review of Systems  Constitutional: Negative for chills, fever and malaise/fatigue.  Eyes: Negative for blurred vision.  Respiratory: Negative for shortness of breath.   Cardiovascular: Negative for chest pain and palpitations.  Gastrointestinal: Negative for abdominal pain and nausea.  Genitourinary: Negative for dysuria and hematuria.  Musculoskeletal: Positive for joint pain. Negative for myalgias.  Skin: Negative for rash.  Neurological: Negative for tingling and headaches.  Psychiatric/Behavioral: Negative for depression. The patient is not nervous/anxious.     Objective:  BP 119/74 (BP Location: Left Arm, Patient Position: Sitting, Cuff Size: Normal)   Pulse 69   Temp 97.8 F (36.6 C) (Oral)   Ht 5\' 1"  (1.549 m)   Wt 180 lb 9.6 oz (81.9 kg)   LMP 02/20/2018 (Exact Date)   SpO2 97%   BMI 34.12 kg/m   BP/Weight 02/20/2018 01/09/2018 10/31/2017  Systolic BP 119 107 126  Diastolic BP 74 68 84  Wt. (Lbs) 180.6 187.4 191.2  BMI 34.12 35.41 36.13      Physical Exam  Constitutional: She is oriented to person, place, and time.  Well developed, well nourished, NAD, polite  HENT:  Head: Normocephalic and atraumatic.  Eyes: No scleral icterus.  Neck: Normal range of motion. Neck supple. No thyromegaly present.  Cardiovascular: Normal rate, regular rhythm and normal heart sounds.  Pulmonary/Chest: Effort normal and breath sounds normal.  Musculoskeletal: She exhibits no edema.  Left knee with small effusion and with mild TTP medially. Right knee with no effusion and with  no TTP. Full aROM of the knee bilaterally.  Neurological: She is alert and oriented to person, place, and time.  Skin: Skin is warm and dry. No rash noted. No erythema. No pallor.  Psychiatric: She has a normal mood and affect. Her behavior is normal. Thought content normal.  Vitals reviewed.    Assessment & Plan:    1. Primary osteoarthritis  of both knees - Continue management with Dr. August Saucerean at [facility]  2. Chronic pain of both knees - Refill naproxen (NAPROSYN) 500 MG tablet; Take 1 tablet (500 mg total) by mouth 2 (two) times daily with a meal.  Dispense: 60 tablet; Refill: 1 - Begin Tylenol #3, one tablet po, q12 hrs prn, #60, zero refills    Meds ordered this encounter  Medications  . acetaminophen-codeine (TYLENOL #3) 300-30 MG tablet    Sig: Take 1 tablet by mouth every 12 (twelve) hours as needed for moderate pain.    Dispense:  60 tablet    Refill:  0    Order Specific Question:   Supervising Provider    Answer:   Hoy RegisterNEWLIN, ENOBONG [[id number]]  . naproxen (NAPROSYN) 500 MG tablet    Sig: Take 1 tablet (500 mg total) by mouth 2 (two) times daily with a meal.    Dispense:  60 tablet    Refill:  1    Order Specific Question:   Supervising Provider    Answer:   Hoy RegisterNEWLIN, ENOBONG [[id number]]    Follow-up: Return if symptoms worsen or fail to improve.   Loletta Specteroger David Lee-Ann Gal PA

## 2018-02-20 NOTE — Patient Instructions (Signed)
Artrosis  Osteoarthritis  La artrosis es un tipo de reumatismo articular que afecta el tejido que cubre los extremos de los huesos en las articulaciones (cartlago). El cartlago acta como amortiguador entre los huesos y los ayuda a moverse con suavidad. La artrosis se produce cuando el cartlago de las articulaciones se gasta. A veces, la artrosis se denomina reumatismo articular "por uso y desgaste".  La artrosis es la forma ms frecuente de reumatismo articular. A menudo, afecta a las personas mayores. Es una enfermedad que empeora con el tiempo (una enfermedad progresiva). Esta enfermedad afecta con ms frecuencia las articulaciones de:   Los dedos de las manos.   Los dedos de los pies.   Las caderas.   Las rodillas.   La columna vertebral, incluido el cuello y la zona lumbar.    Cules son las causas?  Esta enfermedad es causada por el desgaste del cartlago que cubre los extremos de los huesos, lo cual tiene relacin con la edad.  Qu incrementa el riesgo?  Los siguientes factores pueden hacer que usted sea ms propenso a tener esta enfermedad:   Edad avanzada.   Tener exceso de peso u obesidad.   Uso excesivo de las articulaciones, como en el caso de los atletas.   Lesin pasada de una articulacin.   Ciruga pasada en una articulacin.   Antecedentes familiares de artrosis.    Cules son los signos o los sntomas?  Los principales sntomas de esta enfermedad son dolor, hinchazn y rigidez en la articulacin. Con el tiempo, la articulacin puede perder su forma. Se pueden desprender pequeos trozos de hueso o cartlago y flotar dentro de la articulacin, lo cual puede causar ms dolor y dao en la articulacin. Pueden formarse pequeos depsitos de hueso (ostefitos) en los extremos de la articulacin. Otros sntomas pueden incluir lo siguiente:   Una sensacin de chirrido o raspado dentro de la articulacin al moverla.   Sonidos de chasquido o crujido al moverse.    Los sntomas pueden  afectar una o ms articulaciones. La artrosis en una articulacin principal, como la rodilla o la cadera, puede causar dolor al caminar o al realizar ejercicio. Si tiene artrosis en las manos, es posible que no pueda agarrar objetos, torcer la mano o controlar pequeos movimientos de las manos y los dedos (motricidad fina).  Cmo se diagnostica?  Esta enfermedad se puede diagnosticar en funcin de lo siguiente:   Sus antecedentes mdicos.   Un examen fsico.   Sus sntomas.   Radiografas de la(s) articulacin(es) afectada(s).   Anlisis de sangre para descartar otros tipos de reumatismo articular.    Cmo se trata?  No hay cura para esta enfermedad, pero el tratamiento puede ayudar a controlar el dolor y mejorar el funcionamiento de la articulacin. Los planes de tratamiento pueden incluir lo siguiente:   Un programa de ejercicios indicado que permita el descanso y el alivio de la articulacin. Puede trabajar con un fisioterapeuta.   Un plan de control del peso.   Tcnicas de alivio del dolor, como las siguientes:  ? Aplicacin de calor y fro en la articulacin.  ? Impulsos elctricos aplicados a las terminaciones nerviosas que se encuentran debajo de la piel (neuroestimulacin elctrica transcutnea [TENS]).  ? Masajes.  ? Ciertos suplementos nutricionales.   Antiiflamatorios no esteroideos o medicamentos recetados para ayudar a aliviar el dolor.   Medicamentos para ayudar a aliviar el dolor y la inflamacin (corticoesteroides). Estos se pueden administrar por boca (va oral) o mediante una inyeccin.     Dispositivos de ayuda, como un dispositivo ortopdico, una frula, un guante especial o un bastn.   Ciruga, como:  ? Una osteotoma. Se hace para reposicionar los huesos y aliviar el dolor o para retirar los trozos sueltos de hueso y cartlago.  ? Ciruga de reemplazo articular. Es posible que necesite esta ciruga si tiene una artrosis muy grave (avanzada).    Siga estas instrucciones en su  casa:  Actividad   Haga descansar las articulaciones afectadas segn las indicaciones del mdico.   No conduzca ni use maquinaria pesada mientras toma analgsicos recetados.   Practique los ejercicios que le indiquen. Es posible que el mdico o el fisioterapeuta le recomienden tipos especficos de ejercicios, tales como:  ? Ejercicios de fortalecimiento. Se realizan para fortalecer los msculos que sostienen las articulaciones afectadas por el reumatismo. Pueden realizarse con peso o con bandas para agregar resistencia.  ? Actividades aerbicas. Son ejercicios, como caminar a paso ligero o hacer gimnasia aerbica acutica, que aumentan la actividad del corazn.  ? Actividades de amplitud de movimientos. Facilitan el movimiento de las articulaciones.  ? Ejercicios de equilibrio y agilidad.  Control del dolor, la rigidez y la hinchazn   Si se lo indican, aplique calor en la zona afectada tan frecuentemente como se lo haya indicado el mdico. Use la fuente de calor que el mdico le recomiende, como una compresa de calor hmedo o una almohadilla trmica.  ? Si tiene un dispositivo de ayuda que se puede quitar, quteselo segn lo indicado por su mdico.  ? Colquese una toalla entre la piel y la fuente de calor. Si el mdico le indica que no se quite el dispositivo de ayuda mientras se aplica calor, coloque una toalla entre el dispositivo de ayuda y la fuente de calor.  ? Aplique el calor durante 20 a 30minutos.  ? Retire la fuente de calor si la piel se le pone de color rojo brillante. Esto es muy importante si no puede sentir el dolor, el calor ni el fro. Puede correr un riesgo mayor de sufrir quemaduras.   Si se lo indican, aplique hielo sobre la articulacin afectada:  ? Si tiene un dispositivo de ayuda que se puede quitar, quteselo segn lo indicado por su mdico.  ? Ponga el hielo en una bolsa plstica.  ? Colquese una toalla entre la piel y la bolsa de hielo. Si el mdico le indica que no se quite el  dispositivo de ayuda mientras se aplica hielo, coloque una toalla entre el dispositivo de ayuda y la bolsa de hielo.  ? Coloque el hielo durante 20minutos, 2 o 3veces por da.  Instrucciones generales   Tome los medicamentos de venta libre y los recetados solamente como se lo haya indicado el mdico.   Mantenga un peso saludable. Siga las instrucciones de su mdico con respecto al control del peso. Estas pueden incluir restricciones en la dieta.   No consuma ningn producto que contenga nicotina o tabaco, como cigarrillos y cigarrillos electrnicos. Estos pueden retrasar la consolidacin del hueso. Si necesita ayuda para dejar de fumar, consulte al mdico.   Use los dispositivos de ayuda como se lo haya indicado el mdico.   Concurra a todas las visitas de control como se lo haya indicado el mdico. Esto es importante.  Dnde encontrar ms informacin:   Instituto Nacional de Reumatismo Articular y Enfermedades Musculoesquelticas y Dermatolgicas (National Institute of Arthritis and Musculoskeletal and Skin Diseases): www.niams.nih.gov   Instituto Nacional sobre el Envejecimiento (National Institute on   Aging): www.nia.nih.gov   Instituto Estadounidense de Reumatologa (American College of Rheumatology): www.rheumatology.org  Comunquese con un mdico si:   La piel se pone roja.   Le aparece una erupcin cutnea.   Siente un dolor que empeora.   Tiene fiebre y siente dolor en la articulacin o el msculo.  Solicite ayuda de inmediato si:   Pierde mucho peso.   Pierde el apetito repentinamente.   Tiene sudoracin nocturna.  Resumen   La artrosis es un tipo de reumatismo articular que afecta el tejido que cubre los extremos de los huesos en las articulaciones (cartlago).   Esta enfermedad es causada por el desgaste del cartlago que cubre los extremos de los huesos, lo cual tiene relacin con la edad.   Los principales sntomas de esta enfermedad son dolor, hinchazn y rigidez en la  articulacin.   No hay cura para esta enfermedad, pero el tratamiento puede ayudar a controlar el dolor y mejorar el funcionamiento de la articulacin.  Esta informacin no tiene como fin reemplazar el consejo del mdico. Asegrese de hacerle al mdico cualquier pregunta que tenga.  Document Released: 04/12/2005 Document Revised: 05/29/2016 Document Reviewed: 03/10/2013  Elsevier Interactive Patient Education  2018 Elsevier Inc.

## 2018-03-27 ENCOUNTER — Ambulatory Visit (INDEPENDENT_AMBULATORY_CARE_PROVIDER_SITE_OTHER): Payer: Self-pay | Admitting: Orthopedic Surgery

## 2018-03-27 ENCOUNTER — Encounter (INDEPENDENT_AMBULATORY_CARE_PROVIDER_SITE_OTHER): Payer: Self-pay | Admitting: Orthopedic Surgery

## 2018-03-27 DIAGNOSIS — M1711 Unilateral primary osteoarthritis, right knee: Secondary | ICD-10-CM

## 2018-03-27 DIAGNOSIS — M1712 Unilateral primary osteoarthritis, left knee: Secondary | ICD-10-CM

## 2018-03-27 NOTE — Progress Notes (Signed)
   Office Visit Note   Patient: Michele Wright           Date of Birth: 03-Jul-1976           MRN: [id number] Visit Date: 03/27/2018 Requested by: Loletta Specter, PA-C [address] [place], [place] [address] PCP: Denny Levy  Subjective: Chief Complaint  Patient presents with  . Right Knee - Follow-up  . Left Knee - Follow-up    HPI: [name] is a patient with bilateral knee arthritis.  She had injections about 2 months ago.  She is done well with the injections.  Currently she is taking no oral medication for the problem.  The pain does not wake her from sleep at night.  Patient has an interpreter with her today.  She has been doing the bike for exercise.  Pain is worse with ambulation.              ROS: All systems reviewed are negative as they relate to the chief complaint within the history of present illness.  Patient denies  fevers or chills.   Assessment & Plan: Visit Diagnoses:  1. Unilateral primary osteoarthritis, right knee   2. Unilateral primary osteoarthritis, left knee     Plan: Impression is bilateral knee pain and arthritis.  Plan is weight loss and non-loadbearing quad strengthening exercises.  Could consider further injections in 6 months.  I will see her back as needed  Follow-Up Instructions: Return if symptoms worsen or fail to improve.   Orders:  No orders of the defined types were placed in this encounter.  No orders of the defined types were placed in this encounter.     Procedures: No procedures performed   Clinical Data: No additional findings.  Objective: Vital Signs: There were no vitals taken for this visit.  Physical Exam:   Constitutional: Patient appears well-developed HEENT:  Head: Normocephalic Eyes:EOM are normal Neck: Normal range of motion Cardiovascular: Normal rate Pulmonary/chest: Effort normal Neurologic: Patient is alert Skin: Skin is warm Psychiatric: Patient has normal mood and  affect    Ortho Exam: Ortho exam demonstrates full active and passive range of motion of both knees.  No effusion in either knee.  Medial greater than lateral joint line tenderness.  Extensor mechanism is intact.  No other masses lymphadenopathy or skin changes noted in either knee region.  Specialty Comments:  No specialty comments available.  Imaging: No results found.   PMFS History: Patient Active Problem List   Diagnosis Date Noted  . Chronic pain of both knees 01/09/2018  . Right medial knee pain 11/11/2015  . Peripheral edema 11/11/2015  . Heel pain, bilateral 01/14/2014  . Asthma, chronic 04/03/2013   Past Medical History:  Diagnosis Date  . Asthma     Family History  Problem Relation Age of Onset  . Breast cancer Sister     History reviewed. No pertinent surgical history. Social History   Occupational History  . Not on file  Tobacco Use  . Smoking status: Never Smoker  . Smokeless tobacco: Never Used  Substance and Sexual Activity  . Alcohol use: Yes    Comment: occasionally  . Drug use: No  . Sexual activity: Not on file

## 2018-04-11 MED FILL — $VENTOLIN HFA 18G INHALER: 108 (90 BAS | 48 days supply | Qty: 54 | Fill #1

## 2018-04-11 MED FILL — $ADVAIR 500/50MCG INHALER: 500-50 | 90 days supply | Qty: 180 | Fill #1

## 2018-04-11 MED FILL — ?ATORVASTATIN 20 MG TABLET: 20 | 30 days supply | Qty: 30 | Fill #3

## 2018-06-21 ENCOUNTER — Ambulatory Visit: Payer: Self-pay | Attending: Family Medicine

## 2018-06-27 ENCOUNTER — Telehealth: Payer: Self-pay | Admitting: Physician Assistant

## 2018-06-27 NOTE — Telephone Encounter (Signed)
I was called to inform that we have all the papers an the CAFA application was sent to be review on 06/26/18 an will take 2 to 3 week to get approval

## 2018-06-27 NOTE — Telephone Encounter (Signed)
Patient called because she says someone called her. She believes it was regarding her financial application. Please follow up.

## 2018-07-11 ENCOUNTER — Ambulatory Visit (INDEPENDENT_AMBULATORY_CARE_PROVIDER_SITE_OTHER): Payer: Self-pay | Admitting: Physician Assistant

## 2018-08-06 ENCOUNTER — Other Ambulatory Visit: Payer: Self-pay

## 2018-08-06 ENCOUNTER — Ambulatory Visit (INDEPENDENT_AMBULATORY_CARE_PROVIDER_SITE_OTHER): Payer: Self-pay | Admitting: Family Medicine

## 2018-08-06 ENCOUNTER — Encounter (INDEPENDENT_AMBULATORY_CARE_PROVIDER_SITE_OTHER): Payer: Self-pay | Admitting: Family Medicine

## 2018-08-06 VITALS — BP 124/78 | HR 59 | Temp 97.5°F | Ht 61.0 in | Wt 192.2 lb

## 2018-08-06 DIAGNOSIS — G8929 Other chronic pain: Secondary | ICD-10-CM

## 2018-08-06 DIAGNOSIS — M170 Bilateral primary osteoarthritis of knee: Secondary | ICD-10-CM

## 2018-08-06 DIAGNOSIS — M25562 Pain in left knee: Secondary | ICD-10-CM

## 2018-08-06 DIAGNOSIS — M25561 Pain in right knee: Secondary | ICD-10-CM

## 2018-08-06 DIAGNOSIS — B351 Tinea unguium: Secondary | ICD-10-CM

## 2018-08-06 MED ORDER — DICLOFENAC SODIUM 1 % TD GEL: 4.0000 g | Freq: Four times a day (QID) | TRANSDERMAL | 11 refills | Status: DC

## 2018-08-06 NOTE — Progress Notes (Signed)
Subjective:    Patient ID: Michele Wright, female    DOB: 11-25-1975, 43 y.o.   MRN: [id number]  HPI       43 year old female who presents secondary to complaint of bilateral knee pain.  Knee pain is about a 5 on a 0-to-10 scale.  Patient states that the pain has been worse in the past but currently the pain is not too bad at today's visit.  Patient reports that her knee pain is increased with walking.  Patient describes the pain as a grinding sensation as if the bones are rubbing together when she is walking.  Patient occasionally takes Tylenol as needed for pain.  Patient also has a concern regarding her left great toenail which recently had a dark discoloration and patient assumed that the tissue on the nail was dead and she removed the discolored tissue.  Patient believes that she may have a fungal infection in her toenail.  Patient denies any current pain in the left great toe.  Patient denies any history of diabetes.  Past Medical History:  Diagnosis Date  . Asthma    Past Surgical History:  Procedure Laterality Date  . NO PAST SURGERIES     Family History  Problem Relation Age of Onset  . Breast cancer Sister   . Social History   Tobacco Use  . Smoking status: Never Smoker  . Smokeless tobacco: Never Used  Substance Use Topics  . Alcohol use: Yes    Comment: occasionally  . Drug use: No  No Known Allergies    Review of Systems  Constitutional: Positive for fatigue. Negative for chills and fever.  Respiratory: Negative for cough and shortness of breath.   Gastrointestinal: Negative for abdominal pain, constipation, diarrhea and nausea.  Endocrine: Negative for polydipsia, polyphagia and polyuria.  Genitourinary: Negative for dysuria and frequency.  Musculoskeletal: Positive for arthralgias, back pain, gait problem and joint swelling.  Neurological: Negative for dizziness and headaches.       Objective:   Physical Exam BP 124/78 (BP Location: Right Arm, Patient  Position: Sitting, Cuff Size: Large)   Pulse (!) 59   Temp (!) 97.5 F (36.4 C) (Oral)   Ht 5\' 1"  (1.549 m)   Wt 192 lb 3.2 oz (87.2 kg)   LMP 07/24/2018 (Exact Date)   SpO2 97%   BMI 36.32 kg/m  Nurse's notes and vital signs reviewed General-well-nourished, well-developed overweight female in no acute distress Lungs-clear to auscultation bilaterally Cardiovascular-regular rate and rhythm Abdomen-soft, nontender Back-no CVA tenderness Musculoskeletal- patient does not appear to have any effusion at the knees at today's visit.  Patient with bilateral lateral joint line tenderness to palpation of both knees and patient with some tenderness over the mid and lower patella of the left knee.  Knee joints are otherwise stable Extremities- patient with some mild, nonpitting left lower extremity edema below the knee Skin- patient with some whitish discoloration to the left great toenail with mild thickening of the nail and patient has removed the upper portion of the left nail.  No erythema drainage or tenderness to palpation     Assessment & Plan:  1. Primary osteoarthritis of both knees Patient has had past imaging done in July 2019 of both knees showing bone-on-bone arthritis in the medial compartment of the right knee and the left knee showed calcification of the patellar tendon and mild medial joint space narrowing.  Patient did see orthopedic doctor, Dr. Lorin PicketScott on 01/23/2018 in follow-up of bilateral knee pain and  back pain and had steroid injection to the left knee.  Patient was seen again on 03/27/2018 for reevaluation and she did have some improvement in knee pain status post injection.  Per note, patient would be eligible for repeat injection in March.  In the interim, prescription provided for Voltaren gel and patient can apply this up to 4 times daily to help with knee pain secondary to osteoarthritis.  Patient will also try to lose weight as suggested by her orthopedic doctor which will also  help with knee pain. - diclofenac sodium (VOLTAREN) 1 % GEL; Apply 4 g topically 4 (four) times daily.  Dispense: 1 Tube; Refill: 11  2. Bilateral chronic knee pain Patient with bilateral chronic knee pain secondary osteoarthritis of the knees.  Prescription provided for Voltaren gel which patient may apply to the painful knee areas up to 4 times daily as needed for knee pain.  Patient will call if she does not feel that this is effective or call when she would like to be referred back to orthopedics - diclofenac sodium (VOLTAREN) 1 % GEL; Apply 4 g topically 4 (four) times daily.  Dispense: 1 Tube; Refill: 11  3. Onychomycosis of left great toe Patient appears to have a fungal infection of the left great toenail.  Discussed with the patient that there are oral medications which can help with nail fungus but these are minimally effective and can cause damage to the liver and patient currently only has minimal fungal infection.  Discussed an over-the-counter medication such as Fungi-nail which is a lacquer that can be applied to the affected nail.  Also discussed a home remedy of using Vicks VapoRub nightly to the affected toenail to prevent infection in the healthy part of the nail and then patient can clip the abnormal appearing area of the nail once it grows out more.  Patient will try over-the-counter medication at this time but will call or return if she has any worsening of the infection, spread to other toenails or pain related to fungal nail infection.  An After Visit Summary was printed and given to the patient.  Return in about 3 months (around 11/05/2018) for f/u chronic issues.

## 2018-08-07 ENCOUNTER — Encounter (INDEPENDENT_AMBULATORY_CARE_PROVIDER_SITE_OTHER): Payer: Self-pay | Admitting: Family Medicine

## 2018-08-07 MED FILL — DICLOFENAC SODIUM 1% GEL: 1 | 6 days supply | Qty: 100 | Fill #0

## 2018-09-10 MED FILL — $ADVAIR 500/50MCG INHALER: 500-50 | 30 days supply | Qty: 60 | Fill #2

## 2018-09-10 MED FILL — $VENTOLIN HFA 18G INHALER: 108 (90 BAS | 75 days supply | Qty: 54 | Fill #2

## 2018-09-26 ENCOUNTER — Encounter (INDEPENDENT_AMBULATORY_CARE_PROVIDER_SITE_OTHER): Payer: Self-pay | Admitting: Primary Care

## 2018-09-26 ENCOUNTER — Ambulatory Visit (INDEPENDENT_AMBULATORY_CARE_PROVIDER_SITE_OTHER): Payer: Self-pay | Admitting: Primary Care

## 2018-09-26 ENCOUNTER — Other Ambulatory Visit: Payer: Self-pay

## 2018-09-26 VITALS — BP 126/79 | HR 69 | Temp 97.9°F | Ht 61.0 in | Wt 194.4 lb

## 2018-09-26 DIAGNOSIS — G8929 Other chronic pain: Secondary | ICD-10-CM

## 2018-09-26 DIAGNOSIS — R7303 Prediabetes: Secondary | ICD-10-CM

## 2018-09-26 DIAGNOSIS — Z760 Encounter for issue of repeat prescription: Secondary | ICD-10-CM

## 2018-09-26 DIAGNOSIS — M25561 Pain in right knee: Secondary | ICD-10-CM

## 2018-09-26 DIAGNOSIS — M25562 Pain in left knee: Secondary | ICD-10-CM

## 2018-09-26 DIAGNOSIS — E7841 Elevated Lipoprotein(a): Secondary | ICD-10-CM

## 2018-09-26 DIAGNOSIS — R059 Cough, unspecified: Secondary | ICD-10-CM

## 2018-09-26 DIAGNOSIS — J392 Other diseases of pharynx: Secondary | ICD-10-CM

## 2018-09-26 DIAGNOSIS — J4520 Mild intermittent asthma, uncomplicated: Secondary | ICD-10-CM

## 2018-09-26 DIAGNOSIS — R05 Cough: Secondary | ICD-10-CM

## 2018-09-26 DIAGNOSIS — M170 Bilateral primary osteoarthritis of knee: Secondary | ICD-10-CM

## 2018-09-26 LAB — POCT GLYCOSYLATED HEMOGLOBIN (HGB A1C): Hemoglobin A1C: 5.8 % — AB (ref 4.0–5.6)

## 2018-09-26 MED ORDER — FLUTICASONE-SALMETEROL 500-50 MCG/DOSE IN AEPB: 1.0000 | INHALATION_SPRAY | Freq: Two times a day (BID) | RESPIRATORY_TRACT | 6 refills | Status: DC

## 2018-09-26 MED ORDER — DICLOFENAC SODIUM 1 % TD GEL: 4.0000 g | Freq: Four times a day (QID) | TRANSDERMAL | 11 refills | Status: DC

## 2018-09-26 MED ORDER — ATORVASTATIN CALCIUM 20 MG PO TABS: 20.0000 mg | ORAL_TABLET | Freq: Every day | ORAL | 3 refills | Status: DC

## 2018-09-26 MED ORDER — MONTELUKAST SODIUM 10 MG PO TABS: 10.0000 mg | ORAL_TABLET | Freq: Every day | ORAL | 11 refills | Status: DC

## 2018-09-26 MED ORDER — ALBUTEROL SULFATE HFA 108 (90 BASE) MCG/ACT IN AERS: 2.0000 | INHALATION_SPRAY | RESPIRATORY_TRACT | 6 refills | Status: DC | PRN

## 2018-09-26 MED FILL — ?ATORVASTATIN 20 MG TABLET: 20 | 30 days supply | Qty: 30 | Fill #0

## 2018-09-26 MED FILL — MONTELUKAST SOD 10 MG TAB: 10 | 30 days supply | Qty: 30 | Fill #0

## 2018-09-26 MED FILL — DICLOFENAC SODIUM 1% GEL: 1 | 6 days supply | Qty: 100 | Fill #0

## 2018-09-26 NOTE — Patient Instructions (Signed)
Tos en los adultos  (Cough, Adult)  La tos ayuda a limpiar la garganta y los pulmones. La tos puede durar solo 2 o 3semanas (aguda) o ms de 8semanas (crnica). Las causas de la tos son varias. Puede ser el signo de una enfermedad o de otro trastorno.  CUIDADOS EN EL HOGAR   Est atento a cualquier cambio en la tos.   Tome los medicamentos solamente como se lo haya indicado el mdico.  ? Si le recetaron un antibitico, tmelo como se lo haya indicado el mdico. No deje de tomarlo aunque comience a sentirse mejor.  ? Hable con el mdico antes de probar un medicamento para la tos.   Beba suficiente lquido para mantener el pis (orina) claro o de color amarillo plido.   Si el aire est seco, use un vaporizador o un humidificador con vapor fro en su casa.   Mantngase alejado de las cosas que lo hacen toser en el trabajo o en su casa.   Si la tos aumenta durante la noche, haga la prueba de usar almohadas adicionales para mantener la cabeza ms elevada mientras duerme.   No fume e intente no estar cerca de humo. Si necesita ayuda para dejar de fumar, consulte al mdico.   No consuma cafena.   No beba alcohol.   Descanse todo lo que sea necesario.  SOLICITE AYUDA SI:   Le aparecen problemas (sntomas) nuevos.   Expectora un lquido amarillento (pus) cuando tose.   La tos no mejora despus de 2 o 3semanas, o empeora.   Los medicamentos no le alivian la tos, y no puede dormir bien.   Siente un dolor que se vuelve ms intenso o que no se alivia con los medicamentos.   Tiene fiebre.   Est bajando de peso y no sabe por qu.   Tiene transpiracin nocturna.  SOLICITE AYUDA DE INMEDIATO SI:   Tose y escupe sangre.   Tiene dificultad para respirar.   Los latidos cardacos son muy rpidos.  Esta informacin no tiene como fin reemplazar el consejo del mdico. Asegrese de hacerle al mdico cualquier pregunta que tenga.  Document Released: 03/16/2011 Document Revised: 03/24/2015 Document Reviewed:  09/09/2014  Elsevier Interactive Patient Education  2019 [employer]

## 2018-09-29 ENCOUNTER — Encounter (INDEPENDENT_AMBULATORY_CARE_PROVIDER_SITE_OTHER): Payer: Self-pay | Admitting: Primary Care

## 2018-10-31 MED FILL — $ADVAIR 500/50MCG INHALER: 500-50 | 90 days supply | Qty: 180 | Fill #0

## 2018-10-31 MED FILL — ?ATORVASTATIN 20 MG TABLET: 20 | 30 days supply | Qty: 30 | Fill #1

## 2018-12-26 ENCOUNTER — Telehealth: Payer: Self-pay | Admitting: Primary Care

## 2018-12-26 NOTE — Telephone Encounter (Signed)
Pt was call ed to be remind that he need to bring the application and the paperwork that she need to apply for CAFa and OC, the dateline is today °

## 2018-12-31 ENCOUNTER — Other Ambulatory Visit: Payer: Self-pay

## 2018-12-31 ENCOUNTER — Ambulatory Visit: Payer: Self-pay | Attending: Family Medicine

## 2019-01-06 ENCOUNTER — Ambulatory Visit (INDEPENDENT_AMBULATORY_CARE_PROVIDER_SITE_OTHER): Payer: Self-pay | Admitting: Primary Care

## 2019-01-07 ENCOUNTER — Other Ambulatory Visit: Payer: Self-pay

## 2019-01-07 ENCOUNTER — Encounter: Payer: Self-pay | Admitting: Primary Care

## 2019-01-07 ENCOUNTER — Ambulatory Visit: Payer: Self-pay | Attending: Primary Care | Admitting: Primary Care

## 2019-01-07 VITALS — BP 126/86 | HR 70 | Temp 98.6°F | Ht 61.0 in | Wt 202.6 lb

## 2019-01-07 DIAGNOSIS — Z760 Encounter for issue of repeat prescription: Secondary | ICD-10-CM

## 2019-01-07 DIAGNOSIS — E7841 Elevated Lipoprotein(a): Secondary | ICD-10-CM

## 2019-01-07 DIAGNOSIS — M25561 Pain in right knee: Secondary | ICD-10-CM

## 2019-01-07 DIAGNOSIS — Z6838 Body mass index (BMI) 38.0-38.9, adult: Secondary | ICD-10-CM

## 2019-01-07 DIAGNOSIS — J4520 Mild intermittent asthma, uncomplicated: Secondary | ICD-10-CM

## 2019-01-07 DIAGNOSIS — G8929 Other chronic pain: Secondary | ICD-10-CM

## 2019-01-07 DIAGNOSIS — M25562 Pain in left knee: Secondary | ICD-10-CM

## 2019-01-07 MED ORDER — ALBUTEROL SULFATE HFA 108 (90 BASE) MCG/ACT IN AERS: 2.0000 | INHALATION_SPRAY | RESPIRATORY_TRACT | 1 refills | Status: DC | PRN

## 2019-01-07 MED ORDER — ATORVASTATIN CALCIUM 20 MG PO TABS: 20.0000 mg | ORAL_TABLET | Freq: Every day | ORAL | 0 refills | Status: DC

## 2019-01-07 MED ORDER — FLUTICASONE-SALMETEROL 500-50 MCG/DOSE IN AEPB: 1.0000 | INHALATION_SPRAY | Freq: Two times a day (BID) | RESPIRATORY_TRACT | 6 refills | Status: DC

## 2019-01-07 MED ORDER — MONTELUKAST SODIUM 10 MG PO TABS: 10.0000 mg | ORAL_TABLET | Freq: Every day | ORAL | 11 refills | Status: DC

## 2019-01-07 NOTE — Patient Instructions (Signed)
Asma en adultos  Asthma, Adult    El asma es una enfermedad prolongada (crnica) que causa episodios recurrentes de estrechamiento de las vas respiratorias. Las vas respiratorias son los conductos que van desde la nariz y la boca hasta los pulmones. Los episodios de asma, tambin denominados ataques de asma, pueden provocar tos, sibilancias, falta de aire y dolor en el pecho. Las vas respiratorias se pueden llenar de mucosidad. Durante el ataque, puede ser difcil respirar. Los ataques de asma pueden ser leves o potencialmente mortales.  El asma no puede curarse, pero los medicamentos y los cambios en el estilo de vida lo ayudarn a controlar la enfermedad y a tratar los ataques agudos.  Cules son las causas?  Se cree que la causa de esta afeccin son factores hereditarios (genticos) y la exposicin a factores ambientales; sin embargo, su causa exacta se desconoce.  Hay muchas cosas que pueden provocar un ataque de asma o intensificar los sntomas de la enfermedad (factores desencadenantes). Los desencadenantes del asma son diferentes para cada persona. Los factores desencadenantes comunes incluyen los siguientes:   Moho.   Polvo.   Humo del cigarrillo.   Cucarachas.   Cosas que pueden causar sntomas de alergia (alrgenos), como el polen del pasto o los rboles y la caspa de los animales.   Sustancias contaminantes del aire, como limpiadores domsticos, humo de lea, niebla txica u olores qumicos.   El aire fro, los cambios de temperatura y el viento (que aumenta la cantidad de moho y polen en el aire).   Emociones intensas, como llorar o rer intensamente.   Estrs.   Ciertos medicamentos (como la aspirina) o algunos frmacos (como los betabloqueantes).   Los sulfitos presentes en los alimentos y las bebidas. Los alimentos y las bebidas que pueden contener sulfitos son las frutas desecadas, las papas fritas y los vinos espumantes.   Enfermedades infecciosas o inflamatorias, como la gripe, el  resfro o la inflamacin de las membranas nasales (rinitis).   Enfermedad por reflujo gastroesofgico (ERGE).   Ejercicios o actividades extenuantes.  Cules son los signos o los sntomas?  Los sntomas de esta afeccin pueden aparecer inmediatamente despus de que se desencadene el asma, o varias horas ms tarde. Algunos sntomas son los siguientes:   Sibilancias. Sonido parecido a un chiflido al respirar.   Tos excesiva durante la noche o temprano por la maana.   Tos frecuente o intensa durante un resfro comn.   Opresin en el pecho.   Falta de aire.   Cansancio (fatiga) al realizar actividades que requieren un mnimo esfuerzo.  Cmo se diagnostica?  Esta afeccin se diagnostica en funcin de lo siguiente:   Sus antecedentes mdicos.   Un examen fsico.   Estudios, entre ellos, los siguientes:  ? Estudios de funcin pulmonar y estudios pulmonares (espirometra). Estos estudios pueden evaluar el flujo de aire en sus pulmones.  ? Pruebas de alergia.  ? Estudios de diagnstico por imgenes, como radiografas.  Cmo se trata?  No hay cura para esta afeccin, pero el tratamiento puede ayudar a controlar los sntomas. Por lo general, el tratamiento del asma incluye lo siguiente:   Identificar y evitar los factores desencadenantes del asma.   Tomar medicamentos para controlar los sntomas. Generalmente, se usan dos tipos de medicamentos para tratar el asma:  ? Medicamentos de control del asma. Estos ayudan a evitar la aparicin de los sntomas. Generalmente, se utilizan todos los das.  ? Medicamentos de alivio o de rescate de accin rpida. Estos   alivian rpidamente los sntomas del asma al expandir las vas respiratorias estrechas. Se utilizan cuando es necesario y proporcionan alivio a corto plazo.   Usar oxgeno complementario. Puede que esto sea necesario durante un episodio grave.   Usar otros medicamentos, como los siguientes:  ? Medicamentos para las alergias, tales como antihistamnicos, en  caso de que sus ataques de asma sean causados por alrgenos.  ? Medicamentos inmunolgicos (inmunomoduladores). Estos medicamentos controlan el sistema inmunitario.   Crear un plan de accin para el asma. Esto es una planificacin por escrito para el control y el tratamiento de los ataques de asma. Este plan incluye lo siguiente:  ? Una lista de los factores desencadenantes del asma y el modo de evitarlos.  ? Informacin sobre el momento en que se deben tomar los medicamentos y cundo hay que cambiar las dosis.  ? Indicaciones sobre el uso de un dispositivo llamado espirmetro. El espirmetro es un dispositivo que mide el funcionamiento de los pulmones y la gravedad de su asma. Lo ayuda a controlar la enfermedad.  Siga estas indicaciones en su casa:  Controlar el ambiente de su casa  Controle el ambiente de su casa de la siguiente manera para evitar los desencadenantes y prevenir los ataques de asma:   Cambie regularmente el filtro de la calefaccin y del aire acondicionado.   Limite el uso de hogares o estufas a lea.   Elimine las plagas (como cucarachas, ratones) y sus excrementos.   Elimine las plantas si observa moho en ellas.   Limpie regularmente los pisos y el polvo. Utilice productos sin perfume.   Intente que otra persona pase la aspiradora con regularidad. Permanezca fuera de las habitaciones mientras las estn aspirando y durante algn tiempo despus. Si usted pasa la aspiradora, use una mscara para polvo de las que se consiguen en la ferretera, una bolsa de aspiradora de doble capa o microfiltro o una aspiradora con un filtro de aire de alta eficiencia (HEPA).   Reemplace las alfombras por pisos de madera, baldosas o vinilo. Las alfombras pueden retener la caspa de los animales y el polvo.   Use almohadas, mantas y cubrecolchones antialrgicos.   Mantenga su habitacin libre de desencadenantes.   Evite las mascotas y mantenga las ventanas cerradas cuando el aire tenga alrgenos.   Lave la  ropa de cama todas las semanas con agua caliente y squela con aire caliente.   Use mantas de polister o algodn.   Limpie baos y cocinas con lavandina. Si fuera posible, pdale a alguien que vuelva a pintar las paredes de estas habitaciones con una pintura resistente a los hongos. Mantngase alejado de las habitaciones que se estn limpiando y pintando.   Lvese las manos frecuentemente con agua y jabn. Use desinfectante para manos si no dispone de agua y jabn.   No permita que nadie fume en su casa.  Instrucciones generales   Tome los medicamentos de venta libre y los recetados solamente como se lo haya indicado el mdico.  ? Hable con el mdico si tiene preguntas acerca de cmo o cundo tomar los medicamentos.  ? Tome nota en caso de que requiera dosis ms frecuentes.   No consuma ningn producto que contenga nicotina o tabaco, como cigarrillos y cigarrillos electrnicos. Si necesita ayuda para dejar de fumar, consulte al mdico. Tambin evite la exposicin al humo de otros fumadores.   Use un espirmetro como se lo haya indicado su mdico. Anote y lleve un registro de los valores.   Conozca   y utilice el plan de accin para el asma a fin de minimizar o detener un ataque de asma sin necesidad de buscar atencin mdica.   Asegrese de estar al da con las vacunas anuales como se lo haya indicado el mdico. Esto puede incluir a vacunas contra la gripe y la neumona.   Evite realizar actividades al aire libre cuando la cantidad de alrgenos es alta y la calidad del aire es baja.   Al realizar actividades de invierno al aire libre, utilice un pasamontaas que le cubra la nariz y la boca. Durante los das fros, ejerctese en el interior, de ser posible.   Entre en calor antes de ejercitarse y tmese su tiempo para un perodo de enfriamiento luego de la actividad.   Concurra a todas las visitas de control como se lo haya indicado el mdico. Esto es importante.  Dnde encontrar ms informacin   Para  obtener ms informacin sobre el asma, acuda a los Centros para el control y la prevencin de enfermedades (Centers for Disease Control and Prevention) en www.cdc.gov/asthma/faqs.htm   Para informacin sobre la calidad del aire, acuda a [employer] en https://airnow.gov/  Comunquese con un mdico si:   Tiene sibilancias, le falta el aire o tiene tos aun cuando toma el medicamento para prevenir los ataques.   La mucosidad que expectora cuando tose (esputo) es ms espesa que lo habitual.   Su esputo cambia de un color claro o blanco a un color amarillo, verde, gris o sanguinolento.   Los medicamentos le causan efectos secundarios, como erupcin cutnea, picazn, hinchazn o dificultad para respirar.   Necesita tomar un medicamento de alivio ms de 2 a 3veces por semana.   Su flujo mximo se mantiene entre el 50% y el 79% de su mejor valor personal despus de seguir el plan de accin durante 1hora.   Tiene fiebre.  Solicite ayuda de inmediato si:   Est empeorando y no responde al tratamiento durante un ataque de asma.   Le falta el aire cuando descansa o cuando hace muy poca actividad fsica.   Tiene dificultad para comer, beber o hablar.   Siente dolor u opresin en el pecho.   Tiene latidos cardacos rpidos o palpitaciones.   Tiene los labios o las uas de un tono azulado.   Siente que est por desvanecerse, est mareado o se desmaya.   Su flujo mximo es menor que el 50% del mejor valor personal.   Se siente demasiado cansado para respirar con normalidad.  Resumen   El asma es una enfermedad prolongada (crnica) que causa episodios recurrentes de estrechamiento de las vas respiratorias. Estos episodios pueden provocar tos, sibilancias, falta de aire y dolor en el pecho.   El asma no puede curarse, pero los medicamentos y los cambios en el estilo de vida lo ayudarn a controlar la enfermedad y a tratar los ataques agudos.   Asegrese de comprender cmo evitar los desencadenantes, y cmo y  cundo usar los medicamentos.   Los ataques de asma pueden ser leves o potencialmente mortales. Consiga ayuda de inmediato si tiene un ataque de asma y no responde al tratamiento con los medicamentos de rescate habituales.  Esta informacin no tiene como fin reemplazar el consejo del mdico. Asegrese de hacerle al mdico cualquier pregunta que tenga.  Document Released: 07/03/2005 Document Revised: 11/02/2016 Document Reviewed: 11/02/2016  [employer]vier Interactive Patient Education  2019 [employer]

## 2019-01-07 NOTE — Progress Notes (Signed)
Acute Office Visit  Subjective:    Patient ID: Michele Wright, female    DOB: 1975/09/11, 43 y.o.   MRN: [id number]  Chief Complaint  Patient presents with  . Asthma    HPI  Patient is in today for follow up on asthma. She has a past medical history of HLD, asthma, seasonal allergies,  plantar fasciitis and bilateral knee pain. Dr. Alphonzo Severance is following her osteoarthritis and she needs follow up if pain is 5 or greater. She continues to have difficulty walking or standing for prolonged periods.   Past Medical History:  Diagnosis Date  . Asthma     Past Surgical History:  Procedure Laterality Date  . NO PAST SURGERIES      Family History  Problem Relation Age of Onset  . Breast cancer Sister     Social History   Socioeconomic History  . Marital status: Single    Spouse name: Not on file  . Number of children: Not on file  . Years of education: Not on file  . Highest education level: Not on file  Occupational History  . Not on file  Social Needs  . Financial resource strain: Not on file  . Food insecurity    Worry: Not on file    Inability: Not on file  . Transportation needs    Medical: Not on file    Non-medical: Not on file  Tobacco Use  . Smoking status: Never Smoker  . Smokeless tobacco: Never Used  Substance and Sexual Activity  . Alcohol use: Yes    Comment: occasionally  . Drug use: No  . Sexual activity: Not on file  Lifestyle  . Physical activity    Days per week: Not on file    Minutes per session: Not on file  . Stress: Not on file  Relationships  . Social Herbalist on phone: Not on file    Gets together: Not on file    Attends religious service: Not on file    Active member of club or organization: Not on file    Attends meetings of clubs or organizations: Not on file    Relationship status: Not on file  . Intimate partner violence    Fear of current or ex partner: Not on file    Emotionally abused: Not on file   Physically abused: Not on file    Forced sexual activity: Not on file  Other Topics Concern  . Not on file  Social History Narrative  . Not on file    Outpatient Medications Prior to Visit  Medication Sig Dispense Refill  . albuterol (PROVENTIL HFA;VENTOLIN HFA) 108 (90 Base) MCG/ACT inhaler Inhale 2 puffs into the lungs every 4 (four) hours as needed for wheezing. 3 Inhaler 6  . atorvastatin (LIPITOR) 20 MG tablet Take 1 tablet (20 mg total) by mouth daily. 90 tablet 3  . Fluticasone-Salmeterol (ADVAIR DISKUS) 500-50 MCG/DOSE AEPB Inhale 1 puff into the lungs 2 (two) times daily. 60 each 6  . montelukast (SINGULAIR) 10 MG tablet Take 1 tablet (10 mg total) by mouth at bedtime. 30 tablet 11  . diclofenac sodium (VOLTAREN) 1 % GEL Apply 4 g topically 4 (four) times daily. 1 Tube 11   No facility-administered medications prior to visit.     No Known Allergies  Review of Systems  Constitutional: Negative.   HENT: Negative.   Eyes:       Presbyopia   Respiratory: Positive for cough  and wheezing.   Cardiovascular: Negative.   Gastrointestinal: Negative.   Genitourinary: Negative.   Musculoskeletal:       Bilateral knee pain  Skin: Negative.   Neurological: Negative.   Endo/Heme/Allergies: Negative.   Psychiatric/Behavioral: Negative.        Objective:    Physical Exam  Constitutional: She is oriented to person, place, and time. She appears well-developed and well-nourished.  HENT:  Head: Normocephalic.  Neck: Neck supple.  Cardiovascular: Normal rate.  Abdominal: Bowel sounds are normal. She exhibits distension.  Musculoskeletal:     Comments: Bilateral crepitus in needs  Neurological: She is oriented to person, place, and time.  Skin: Skin is warm and dry.  Prominent varicose veins  Psychiatric: She has a normal mood and affect.    BP 126/86 (BP Location: Right Arm, Patient Position: Sitting, Cuff Size: Large)   Pulse 70   Temp 98.6 F (37 C) (Oral)   Ht 5\' 1"   (1.549 m)   Wt 202 lb 9.6 oz (91.9 kg)   SpO2 98%   BMI 38.28 kg/m  Wt Readings from Last 3 Encounters:  01/07/19 202 lb 9.6 oz (91.9 kg)  09/26/18 194 lb 6.4 oz (88.2 kg)  08/06/18 192 lb 3.2 oz (87.2 kg)    Health Maintenance Due  Topic Date Due  . PNEUMOCOCCAL POLYSACCHARIDE VACCINE AGE 41-64 HIGH RISK  06/03/1976    There are no preventive care reminders to display for this patient.   Lab Results  Component Value Date   TSH 1.381 04/23/2014   Lab Results  Component Value Date   WBC 8.6 04/23/2014   HGB 13.7 04/23/2014   HCT 40.5 04/23/2014   MCV 84.0 04/23/2014   PLT 245 04/23/2014   Lab Results  Component Value Date   NA 142 11/12/2017   K 4.2 11/12/2017   CO2 21 11/12/2017   GLUCOSE 99 11/12/2017   BUN 12 11/12/2017   CREATININE 0.64 11/12/2017   BILITOT 0.2 11/12/2017   ALKPHOS 89 11/12/2017   AST 16 11/12/2017   ALT 22 11/12/2017   PROT 6.4 11/12/2017   ALBUMIN 3.8 11/12/2017   CALCIUM 8.9 11/12/2017   Lab Results  Component Value Date   CHOL 171 11/12/2017   Lab Results  Component Value Date   HDL 46 11/12/2017   Lab Results  Component Value Date   LDLCALC 101 (H) 11/12/2017   Lab Results  Component Value Date   TRIG 119 11/12/2017   Lab Results  Component Value Date   CHOLHDL 3.7 11/12/2017   Lab Results  Component Value Date   HGBA1C 5.8 (A) 09/26/2018       Assessment & Plan:   Problem List Items Addressed This Visit    Asthma, chronic - Primary (Chronic)   Relevant Medications   Fluticasone-Salmeterol (ADVAIR DISKUS) 500-50 MCG/DOSE AEPB   montelukast (SINGULAIR) 10 MG tablet   albuterol (VENTOLIN HFA) 108 (90 Base) MCG/ACT inhaler   Chronic pain of both knees    Other Visit Diagnoses    Medication refill       Relevant Medications   Fluticasone-Salmeterol (ADVAIR DISKUS) 500-50 MCG/DOSE AEPB   montelukast (SINGULAIR) 10 MG tablet   albuterol (VENTOLIN HFA) 108 (90 Base) MCG/ACT inhaler   Elevated lipoprotein(a)        Relevant Orders   Lipid panel   Comprehensive metabolic panel   Body mass index (BMI) of 38.0-38.9 in adult        Byrd HesselbachMaria was seen today for asthma.  Diagnoses and all orders for this visit:  Mild intermittent chronic asthma without complication Asthma is being controlled with combination therapy of inhaled corticosteroids and long-acting beta agonist.  Short acting beta agonist for shortness of breath/wheezing and Singulair.    Medication refill -     Fluticasone-Salmeterol (ADVAIR DISKUS) 500-50 MCG/DOSE AEPB; Inhale 1 puff into the lungs 2 (two) times daily. -     montelukast (SINGULAIR) 10 MG tablet; Take 1 tablet (10 mg total) by mouth at bedtime. -     albuterol (VENTOLIN HFA) 108 (90 Base) MCG/ACT inhaler; Inhale 2 puffs into the lungs every 4 (four) hours as needed for wheezing.  Elevated lipoprotein(a) No recent labs therefore will discontinue statins reevaluate when labs are completed.   -     Discontinue: atorvastatin (LIPITOR) 20 MG tablet; Take 1 tablet (20 mg total) by mouth daily. -     Lipid panel; Future -     Comprehensive metabolic panel; Future  Chronic pain of both knees Bilateral osteo-arthritis knees followed by Dr. Dorene GrebeScott Dean plan of care reviewed encourage weight loss non-loadbearing quad strengthening exercise.  In 6 months he will reevaluate the need for further injections.       Meds ordered this encounter  Medications  . Fluticasone-Salmeterol (ADVAIR DISKUS) 500-50 MCG/DOSE AEPB    Sig: Inhale 1 puff into the lungs 2 (two) times daily.    Dispense:  60 each    Refill:  6    Please talk to patient about PASS program.  . montelukast (SINGULAIR) 10 MG tablet    Sig: Take 1 tablet (10 mg total) by mouth at bedtime.    Dispense:  30 tablet    Refill:  11    Please talk to patient about PASS program.  . albuterol (VENTOLIN HFA) 108 (90 Base) MCG/ACT inhaler    Sig: Inhale 2 puffs into the lungs every 4 (four) hours as needed for wheezing.     Dispense:  108 g    Refill:  1    Please talk to patient about PASS program.  . DISCONTD: atorvastatin (LIPITOR) 20 MG tablet    Sig: Take 1 tablet (20 mg total) by mouth daily.    Dispense:  90 tablet    Refill:  0     Grayce SessionsMichelle P Edwards, NP

## 2019-01-08 MED FILL — $VENTOLIN HFA 18G INHALER: 108 (90 BAS | 16 days supply | Qty: 18 | Fill #0

## 2019-01-08 MED FILL — MONTELUKAST SOD 10 MG TAB: 10 | 30 days supply | Qty: 30 | Fill #1

## 2019-01-08 MED FILL — ?ATORVASTATIN 20 MG TABLET: 20 | 30 days supply | Qty: 30 | Fill #2

## 2019-02-07 ENCOUNTER — Other Ambulatory Visit: Payer: Self-pay | Admitting: Emergency Medicine

## 2019-02-07 ENCOUNTER — Other Ambulatory Visit: Payer: Self-pay | Admitting: Family Medicine

## 2019-02-07 DIAGNOSIS — Z1231 Encounter for screening mammogram for malignant neoplasm of breast: Secondary | ICD-10-CM

## 2019-03-28 MED FILL — !ADVAIR 500/50 DISKUS: 500-50 | 30 days supply | Qty: 60 | Fill #1

## 2019-03-28 MED FILL — $VENTOLIN HFA 18G INHALER: 108 (90 BAS | 16 days supply | Qty: 18 | Fill #1

## 2019-03-31 ENCOUNTER — Other Ambulatory Visit: Payer: Self-pay

## 2019-03-31 ENCOUNTER — Ambulatory Visit
Admission: RE | Admit: 2019-03-31 | Discharge: 2019-03-31 | Disposition: A | Discharge status: Home / Self Care | Payer: No Typology Code available for payment source | Source: Ambulatory Visit | Attending: Family Medicine | Admitting: Family Medicine

## 2019-03-31 DIAGNOSIS — Z1231 Encounter for screening mammogram for malignant neoplasm of breast: Secondary | ICD-10-CM

## 2019-04-07 ENCOUNTER — Other Ambulatory Visit: Payer: Self-pay

## 2019-04-07 ENCOUNTER — Ambulatory Visit (INDEPENDENT_AMBULATORY_CARE_PROVIDER_SITE_OTHER): Payer: Self-pay | Admitting: Primary Care

## 2019-04-07 DIAGNOSIS — M170 Bilateral primary osteoarthritis of knee: Secondary | ICD-10-CM

## 2019-04-07 DIAGNOSIS — G8929 Other chronic pain: Secondary | ICD-10-CM

## 2019-04-07 DIAGNOSIS — M25562 Pain in left knee: Secondary | ICD-10-CM

## 2019-04-07 DIAGNOSIS — Z760 Encounter for issue of repeat prescription: Secondary | ICD-10-CM

## 2019-04-07 DIAGNOSIS — J4520 Mild intermittent asthma, uncomplicated: Secondary | ICD-10-CM

## 2019-04-07 DIAGNOSIS — M25561 Pain in right knee: Secondary | ICD-10-CM

## 2019-04-07 MED ORDER — DICLOFENAC SODIUM 1 % TD GEL: 4.0000 g | Freq: Four times a day (QID) | TRANSDERMAL | 3 refills | Status: DC

## 2019-04-07 MED ORDER — FLUTICASONE-SALMETEROL 500-50 MCG/DOSE IN AEPB: 1.0000 | INHALATION_SPRAY | Freq: Two times a day (BID) | RESPIRATORY_TRACT | 6 refills | Status: DC

## 2019-04-07 MED ORDER — ALBUTEROL SULFATE HFA 108 (90 BASE) MCG/ACT IN AERS: 2.0000 | INHALATION_SPRAY | RESPIRATORY_TRACT | 1 refills | Status: DC | PRN

## 2019-04-07 MED ORDER — MONTELUKAST SODIUM 10 MG PO TABS: 10.0000 mg | ORAL_TABLET | Freq: Every day | ORAL | 11 refills | Status: DC

## 2019-04-07 NOTE — Progress Notes (Signed)
Virtual Visit via Telephone Note  I connected with Michele Wright on 04/07/19 at  3:50 PM EDT by telephone and verified that I am speaking with the correct person using two identifiers.   I discussed the limitations, risks, security and privacy concerns of performing an evaluation and management service by telephone and the availability of in person appointments. I also discussed with the patient that there may be a patient responsible charge related to this service. The patient expressed understanding and agreed to proceed.   History of Present Illness: [name] is having a tele visit  for follow up on asthma. She is voicing no complaints or concerns requesting medication refills.Patient dis ask if flu shot was available . She will call to make an appointment for the influenza vaccine.   Past Medical History:  Diagnosis Date  . Asthma    Observations/Objective: Review of Systems  All other systems reviewed and are negative.   Assessment and Plan: Michele Wright was seen today for follow-up and medication refill.  Diagnoses and all orders for this visit:  Mild intermittent chronic asthma without complication Asthma is recognize by classic presentation of symptoms which include cough, wheeze, and shortness of breath brought on by characteristic triggers and relieved by bronchodilating medications.  This is caused by from hyper responsiveness with tendency of airways to narrow excessively in response to a stimuli.  Medication refill -     Fluticasone-Salmeterol (ADVAIR DISKUS) 500-50 MCG/DOSE AEPB; Inhale 1 puff into the lungs 2 (two) times daily. -     montelukast (SINGULAIR) 10 MG tablet; Take 1 tablet (10 mg total) by mouth at bedtime. -     albuterol (VENTOLIN HFA) 108 (90 Base) MCG/ACT inhaler; Inhale 2 puffs into the lungs every 4 (four) hours as needed for wheezing.  Primary osteoarthritis of both knees Work on losing weight to help reduce joint pain. May alternate with  heat and ice application for pain relief. May also alternate with acetaminophen and Ibuprofen as prescribed pain relief. Other alternatives include massage, acupuncture and water aerobics.  You must stay active and avoid a sedentary lifestyle. -     diclofenac sodium (VOLTAREN) 1 % GEL; Apply 4 g topically 4 (four) times daily.  Bilateral chronic knee pain Secondary to osteoarthritis  -     diclofenac sodium (VOLTAREN) 1 % GEL; Apply 4 g topically 4 (four) times daily.    Follow Up Instructions:    I discussed the assessment and treatment plan with the patient. The patient was provided an opportunity to ask questions and all were answered. The patient agreed with the plan and demonstrated an understanding of the instructions.   The patient was advised to call back or seek an in-person evaluation if the symptoms worsen or if the condition fails to improve as anticipated.  I provided 14 minutes of non-face-to-face time during this encounter.   Kerin Perna, NP

## 2019-04-08 MED FILL — MONTELUKAST SOD 10 MG TAB: 10 | 30 days supply | Qty: 30 | Fill #0

## 2019-04-08 MED FILL — !VENTOLIN HFA INHALER: 108 (90 BAS | 16 days supply | Qty: 18 | Fill #0

## 2019-04-08 MED FILL — DICLOFENAC SODIUM 1% GEL: 1 | 6 days supply | Qty: 100 | Fill #0

## 2019-04-10 ENCOUNTER — Ambulatory Visit (INDEPENDENT_AMBULATORY_CARE_PROVIDER_SITE_OTHER): Payer: Self-pay | Admitting: Primary Care

## 2019-04-21 ENCOUNTER — Other Ambulatory Visit: Payer: Self-pay

## 2019-04-21 ENCOUNTER — Ambulatory Visit (INDEPENDENT_AMBULATORY_CARE_PROVIDER_SITE_OTHER): Payer: Self-pay

## 2019-04-21 DIAGNOSIS — Z23 Encounter for immunization: Secondary | ICD-10-CM

## 2019-05-06 ENCOUNTER — Telehealth: Payer: Self-pay | Admitting: Primary Care

## 2019-05-06 NOTE — Telephone Encounter (Signed)
She was approve for CAFA

## 2019-05-06 NOTE — Telephone Encounter (Signed)
Patient called wanting to get some information about CAFA please follow up

## 2019-06-04 ENCOUNTER — Other Ambulatory Visit: Payer: Self-pay

## 2019-06-04 ENCOUNTER — Other Ambulatory Visit (INDEPENDENT_AMBULATORY_CARE_PROVIDER_SITE_OTHER): Payer: Self-pay

## 2019-06-04 DIAGNOSIS — E7841 Elevated Lipoprotein(a): Secondary | ICD-10-CM

## 2019-06-05 LAB — COMPREHENSIVE METABOLIC PANEL
ALT: 27 IU/L (ref 0–32)
AST: 20 IU/L (ref 0–40)
Albumin/Globulin Ratio: 1.3 (ref 1.2–2.2)
Albumin: 3.8 g/dL (ref 3.8–4.8)
Alkaline Phosphatase: 98 IU/L (ref 39–117)
BUN/Creatinine Ratio: 21 (ref 9–23)
BUN: 13 mg/dL (ref 6–24)
Bilirubin Total: <0.2 mg/dL (ref 0.0–1.2)
CO2: 22 mmol/L (ref 20–29)
Calcium: 9.3 mg/dL (ref 8.7–10.2)
Chloride: 104 mmol/L (ref 96–106)
Creatinine, Ser: 0.63 mg/dL (ref 0.57–1.00)
GFR calc Af Amer: 125 mL/min/{1.73_m2} (ref >59)
GFR calc non Af Amer: 109 mL/min/{1.73_m2} (ref >59)
Globulin, Total: 3 g/dL (ref 1.5–4.5)
Glucose: 111 mg/dL — ABNORMAL HIGH (ref 65–99)
Potassium: 4.5 mmol/L (ref 3.5–5.2)
Sodium: 140 mmol/L (ref 134–144)
Total Protein: 6.8 g/dL (ref 6.0–8.5)

## 2019-06-05 LAB — LIPID PANEL
Chol/HDL Ratio: 4 ratio (ref 0.0–4.4)
Cholesterol, Total: 190 mg/dL (ref 100–199)
HDL: 48 mg/dL (ref >39)
LDL Chol Calc (NIH): 120 mg/dL — ABNORMAL HIGH (ref 0–99)
Triglycerides: 120 mg/dL (ref 0–149)
VLDL Cholesterol Cal: 22 mg/dL (ref 5–40)

## 2019-06-16 ENCOUNTER — Telehealth: Payer: Self-pay | Admitting: Primary Care

## 2019-06-16 MED FILL — !ADVAIR 500/50 DISKUS: 500-50 | 30 days supply | Qty: 60 | Fill #2

## 2019-06-16 MED FILL — !VENTOLIN HFA INHALER: 108 (90 BAS | 16 days supply | Qty: 18 | Fill #2

## 2019-06-16 NOTE — Telephone Encounter (Signed)
Patient called requesting to speak with Michele Wright regarding her CAFA. Patient states that her CAFA is going to expire and she is not working. Please f/u with pt.

## 2019-06-17 NOTE — Telephone Encounter (Signed)
Pt was informed of the appt with financial

## 2019-06-18 ENCOUNTER — Telehealth (INDEPENDENT_AMBULATORY_CARE_PROVIDER_SITE_OTHER): Payer: Self-pay

## 2019-06-18 NOTE — Telephone Encounter (Signed)
Call placed using pacific interpreter [phone]) patient aware Not starting medication start with life style modification weight loss Your LDL is not in range. Your LDL is the bad cholesterol that can lead to heart attack and stroke. To lower your number you can decrease your fatty foods, red meat, cheese, milk and increase fiber like whole grains and veggies. You can also add a fiber supplement like Metamucil or Benefiber.  I have reviewed all labs and they are normal. Nat Christen, CMA

## 2019-06-18 NOTE — Telephone Encounter (Signed)
-----   Message from Kerin Perna, NP sent at 06/10/2019 10:55 AM EST ----- Not starting medication start with life style modification weight loss Your LDL is not in range. Your LDL is the bad cholesterol that can lead to heart attack and stroke. To lower your number you can decrease your fatty foods, red meat, cheese, milk and increase fiber like whole grains and veggies. You can also add a fiber supplement like Metamucil or Benefiber.  I have reviewed all labs and they are normal

## 2019-07-02 ENCOUNTER — Ambulatory Visit: Payer: No Typology Code available for payment source

## 2019-07-09 ENCOUNTER — Ambulatory Visit (INDEPENDENT_AMBULATORY_CARE_PROVIDER_SITE_OTHER): Payer: No Typology Code available for payment source | Admitting: Primary Care

## 2019-08-04 ENCOUNTER — Ambulatory Visit: Payer: Self-pay

## 2019-08-04 ENCOUNTER — Other Ambulatory Visit: Payer: Self-pay

## 2019-08-06 ENCOUNTER — Ambulatory Visit (INDEPENDENT_AMBULATORY_CARE_PROVIDER_SITE_OTHER): Payer: Self-pay | Admitting: Primary Care

## 2019-08-06 ENCOUNTER — Encounter (INDEPENDENT_AMBULATORY_CARE_PROVIDER_SITE_OTHER): Payer: Self-pay | Admitting: Primary Care

## 2019-08-06 ENCOUNTER — Other Ambulatory Visit: Payer: Self-pay

## 2019-08-06 DIAGNOSIS — J4520 Mild intermittent asthma, uncomplicated: Secondary | ICD-10-CM

## 2019-08-06 DIAGNOSIS — M170 Bilateral primary osteoarthritis of knee: Secondary | ICD-10-CM

## 2019-08-06 DIAGNOSIS — Z760 Encounter for issue of repeat prescription: Secondary | ICD-10-CM

## 2019-08-06 MED ORDER — MONTELUKAST SODIUM 10 MG PO TABS: 10.0000 mg | ORAL_TABLET | Freq: Every day | ORAL | 11 refills | Status: DC

## 2019-08-06 MED ORDER — FLUTICASONE-SALMETEROL 500-50 MCG/DOSE IN AEPB: 1.0000 | INHALATION_SPRAY | Freq: Two times a day (BID) | RESPIRATORY_TRACT | 6 refills | Status: DC

## 2019-08-06 MED ORDER — ALBUTEROL SULFATE HFA 108 (90 BASE) MCG/ACT IN AERS: 2.0000 | INHALATION_SPRAY | RESPIRATORY_TRACT | 1 refills | Status: DC | PRN

## 2019-08-06 MED ORDER — DICLOFENAC SODIUM 1 % EX GEL: 4.0000 g | Freq: Four times a day (QID) | CUTANEOUS | 3 refills | Status: DC

## 2019-08-06 MED FILL — DICLOFENAC SODIUM 1% GEL: 1 | 6 days supply | Qty: 100 | Fill #0

## 2019-08-06 MED FILL — MONTELUKAST SOD 10 MG TAB: 10 | 30 days supply | Qty: 30 | Fill #0

## 2019-08-06 MED FILL — !ADVAIR 500/50 DISKUS: 500-50 | 30 days supply | Qty: 60 | Fill #0

## 2019-08-06 MED FILL — !VENTOLIN HFA INHALER: 108 (90 BAS | 16 days supply | Qty: 18 | Fill #0

## 2019-08-06 NOTE — Progress Notes (Signed)
Pt needs refill of inhalers and voltaren

## 2019-08-06 NOTE — Progress Notes (Signed)
Virtual Visit via Telephone Note  I connected with Michele Wright on 08/06/19 at  9:30 AM EST by telephone and verified that I am speaking with the correct person using two identifiers.   I discussed the limitations, risks, security and privacy concerns of performing an evaluation and management service by telephone and the availability of in person appointments. I also discussed with the patient that there may be a patient responsible charge related to this service. The patient expressed understanding and agreed to proceed.  ID [id number] Michele Wright interpretor  History of Present Illness: Michele Wright is having a tele visit for pain in the knee and management of asthma. Feels fatigue when walking and gets out of breath.Requesting medication refills. Voiced concern not wanting to go out in this time and doesn't want to be out of medication.    Past Medical History:  Diagnosis Date  . Asthma    Current Outpatient Medications on File Prior to Visit  Medication Sig Dispense Refill  . albuterol (VENTOLIN HFA) 108 (90 Base) MCG/ACT inhaler Inhale 2 puffs into the lungs every 4 (four) hours as needed for wheezing. 108 g 1  . diclofenac sodium (VOLTAREN) 1 % GEL Apply 4 g topically 4 (four) times daily. 100 g 3  . Fluticasone-Salmeterol (ADVAIR DISKUS) 500-50 MCG/DOSE AEPB Inhale 1 puff into the lungs 2 (two) times daily. 60 each 6  . montelukast (SINGULAIR) 10 MG tablet Take 1 tablet (10 mg total) by mouth at bedtime. 30 tablet 11   No current facility-administered medications on file prior to visit.   Observations/Objective: Review of Systems  Constitutional: Positive for malaise/fatigue.  Respiratory: Positive for shortness of breath and wheezing.   Musculoskeletal:       Bilateral knee pain  All other systems reviewed and are negative.  Assessment and Plan: Michele Wright was seen today for asthma and fatigue.  Diagnoses and all orders for this visit:  Mild intermittent chronic asthma  without complication Patient is only using her rescue inhaler for rescue with  symptoms of cough, wheeze, and shortness of breath brought on with her walking or exertion.  Refilled all asthma medication.  Medication refill -     Fluticasone-Salmeterol (ADVAIR DISKUS) 500-50 MCG/DOSE AEPB; Inhale 1 puff into the lungs 2 (two) times daily. -     montelukast (SINGULAIR) 10 MG tablet; Take 1 tablet (10 mg total) by mouth at bedtime. -     albuterol (VENTOLIN HFA) 108 (90 Base) MCG/ACT inhaler; Inhale 2 puffs into the lungs every 4 (four) hours as needed for wheezing.  Primary osteoarthritis of both knees Work on losing weight to help reduce joint pain. May alternate with heat and ice application for pain relief. May also alternate with acetaminophen and Ibuprofen as prescribed pain relief. Other alternatives include massage, acupuncture and water aerobics.  You must stay active and avoid a sedentary lifestyle. Continue diclofenac Sodium (VOLTAREN) 1 % GEL; Apply 4 g topically 4 (four) times daily as needed  Other orders -     diclofenac Sodium (VOLTAREN) 1 % GEL; Apply 4 g topically 4 (four) times daily.    Follow Up Instructions:    I discussed the assessment and treatment plan with the patient. The patient was provided an opportunity to ask questions and all were answered. The patient agreed with the plan and demonstrated an understanding of the instructions.   The patient was advised to call back or seek an in-person evaluation if the symptoms worsen or if the condition fails to improve  as anticipated.  I provided 20 minutes of non-face-to-face time during this encounter.   Grayce Sessions, NP

## 2019-09-08 ENCOUNTER — Other Ambulatory Visit: Payer: Self-pay

## 2019-09-08 ENCOUNTER — Ambulatory Visit (INDEPENDENT_AMBULATORY_CARE_PROVIDER_SITE_OTHER): Payer: Self-pay | Admitting: Primary Care

## 2019-09-08 ENCOUNTER — Encounter (INDEPENDENT_AMBULATORY_CARE_PROVIDER_SITE_OTHER): Payer: Self-pay | Admitting: Primary Care

## 2019-09-08 VITALS — BP 141/87 | HR 68 | Temp 97.3°F | Ht 61.0 in | Wt 199.4 lb

## 2019-09-08 DIAGNOSIS — M25562 Pain in left knee: Secondary | ICD-10-CM

## 2019-09-08 DIAGNOSIS — J4520 Mild intermittent asthma, uncomplicated: Secondary | ICD-10-CM

## 2019-09-08 DIAGNOSIS — Z23 Encounter for immunization: Secondary | ICD-10-CM

## 2019-09-08 DIAGNOSIS — Z0000 Encounter for general adult medical examination without abnormal findings: Secondary | ICD-10-CM

## 2019-09-08 DIAGNOSIS — Z760 Encounter for issue of repeat prescription: Secondary | ICD-10-CM

## 2019-09-08 DIAGNOSIS — M25561 Pain in right knee: Secondary | ICD-10-CM

## 2019-09-08 DIAGNOSIS — R7303 Prediabetes: Secondary | ICD-10-CM

## 2019-09-08 DIAGNOSIS — G8929 Other chronic pain: Secondary | ICD-10-CM

## 2019-09-08 LAB — POCT GLYCOSYLATED HEMOGLOBIN (HGB A1C): Hemoglobin A1C: 5.8 % — AB (ref 4.0–5.6)

## 2019-09-08 MED ORDER — LISINOPRIL 5 MG PO TABS: 5.0000 mg | ORAL_TABLET | Freq: Every day | ORAL | 1 refills | Status: DC

## 2019-09-08 MED ORDER — ALBUTEROL SULFATE HFA 108 (90 BASE) MCG/ACT IN AERS: 2.0000 | INHALATION_SPRAY | RESPIRATORY_TRACT | 1 refills | Status: DC | PRN

## 2019-09-08 MED ORDER — FLUTICASONE-SALMETEROL 500-50 MCG/DOSE IN AEPB: 1.0000 | INHALATION_SPRAY | Freq: Two times a day (BID) | RESPIRATORY_TRACT | 6 refills | Status: DC

## 2019-09-08 MED ORDER — DICLOFENAC SODIUM 1 % EX GEL: 4.0000 g | Freq: Four times a day (QID) | CUTANEOUS | 3 refills | Status: DC

## 2019-09-08 MED ORDER — MONTELUKAST SODIUM 10 MG PO TABS: 10.0000 mg | ORAL_TABLET | Freq: Every day | ORAL | 11 refills | Status: DC

## 2019-09-08 MED FILL — ALBUTEROL SULFATE HFA 108 (: 108 (90 BAS | 25 days supply | Qty: 18 | Fill #0

## 2019-09-08 MED FILL — !ADVAIR 500/50 DISKUS: 500-50 | 30 days supply | Qty: 60 | Fill #0

## 2019-09-08 MED FILL — MONTELUKAST SOD 10 MG TAB: 10 | 30 days supply | Qty: 30 | Fill #0

## 2019-09-08 MED FILL — DICLOFENAC SODIUM 1 % GEL: 1 | 7 days supply | Qty: 100 | Fill #0

## 2019-09-08 MED FILL — LISINOPRIL 5 MG TABLET: 5 | 30 days supply | Qty: 30 | Fill #0

## 2019-09-08 NOTE — Progress Notes (Signed)
Pain at navel for about 4 days has now subsided- felt like pin and needles. It would come and go  Pt complains of dryness in mouth throat and lips

## 2019-09-08 NOTE — Progress Notes (Signed)
Established Patient Office Visit  Subjective:  Patient ID: Michele Wright, female    DOB: 1976/06/15  Age: 44 y.o. MRN: [id number]  CC:  Chief Complaint  Patient presents with  . Annual Exam    pain at navel     HPI Michele Wright presents for originally navel pain it lasted 4 days she described it as pins going through her navel she is not able to associate foods or liquids with the pain. Denies constipation. Self Resolved. Blood pressure is elevated at this visit. She denies shortness of breast, chest pain and lower extremity edema. A1C stable prediabetes 5.8 denies increase in hunger or thirst but states her mouth stays dry. She drinks plenty of water which she relates to urinary frequency  Past Medical History:  Diagnosis Date  . Asthma     Past Surgical History:  Procedure Laterality Date  . NO PAST SURGERIES      Family History  Problem Relation Age of Onset  . Breast cancer Sister     Social History   Socioeconomic History  . Marital status: Single    Spouse name: Not on file  . Number of children: Not on file  . Years of education: Not on file  . Highest education level: Not on file  Occupational History  . Not on file  Tobacco Use  . Smoking status: Never Smoker  . Smokeless tobacco: Never Used  Substance and Sexual Activity  . Alcohol use: Yes    Comment: occasionally  . Drug use: No  . Sexual activity: Not on file  Other Topics Concern  . Not on file  Social History Narrative  . Not on file   Social Determinants of Health   Financial Resource Strain:   . Difficulty of Paying Living Expenses: Not on file  Food Insecurity:   . Worried About Programme researcher, broadcasting/film/video in the Last Year: Not on file  . Ran Out of Food in the Last Year: Not on file  Transportation Needs:   . Lack of Transportation (Medical): Not on file  . Lack of Transportation (Non-Medical): Not on file  Physical Activity:   . Days of Exercise per Week: Not on file  .  Minutes of Exercise per Session: Not on file  Stress:   . Feeling of Stress : Not on file  Social Connections:   . Frequency of Communication with Friends and Family: Not on file  . Frequency of Social Gatherings with Friends and Family: Not on file  . Attends Religious Services: Not on file  . Active Member of Clubs or Organizations: Not on file  . Attends Banker Meetings: Not on file  . Marital Status: Not on file  Intimate Partner Violence:   . Fear of Current or Ex-Partner: Not on file  . Emotionally Abused: Not on file  . Physically Abused: Not on file  . Sexually Abused: Not on file    Outpatient Medications Prior to Visit  Medication Sig Dispense Refill  . albuterol (VENTOLIN HFA) 108 (90 Base) MCG/ACT inhaler Inhale 2 puffs into the lungs every 4 (four) hours as needed for wheezing. 108 g 1  . diclofenac Sodium (VOLTAREN) 1 % GEL Apply 4 g topically 4 (four) times daily. 350 g 3  . Fluticasone-Salmeterol (ADVAIR DISKUS) 500-50 MCG/DOSE AEPB Inhale 1 puff into the lungs 2 (two) times daily. 60 each 6  . montelukast (SINGULAIR) 10 MG tablet Take 1 tablet (10 mg total) by mouth at bedtime. 30  tablet 11   No facility-administered medications prior to visit.    No Known Allergies  ROS Review of Systems  Gastrointestinal: Positive for abdominal pain.       Self limited   Musculoskeletal:       Bilateral knee pain was told from ortho per patient when pain became unbearable to return for follow up until then take tylenol and ibuprofen   All other systems reviewed and are negative.     Objective:    Physical Exam  Constitutional: She appears well-developed and well-nourished.  obese  HENT:  Head: Normocephalic.  Dry mouth continue to drink water and may have sugar free hard candy  Eyes: Pupils are equal, round, and reactive to light. EOM are normal.  Abdominal: Soft. Bowel sounds are normal.  Musculoskeletal:        General: Tenderness present.      Cervical back: Normal range of motion and neck supple.     Comments: Crepitus bilateral knees left>right  Neurological: She has normal reflexes.  Knees DTR painful  Skin: Skin is warm and dry.  Psychiatric: She has a normal mood and affect. Her behavior is normal. Judgment and thought content normal.    BP (!) 141/87 (BP Location: Right Arm, Patient Position: Sitting, Cuff Size: Normal)   Pulse 68   Temp (!) 97.3 F (36.3 C) (Temporal)   Ht 5\' 1"  (1.549 m)   Wt 199 lb 6.4 oz (90.4 kg)   LMP 08/14/2019 (Exact Date)   SpO2 96%   BMI 37.68 kg/m  Wt Readings from Last 3 Encounters:  09/08/19 199 lb 6.4 oz (90.4 kg)  01/07/19 202 lb 9.6 oz (91.9 kg)  09/26/18 194 lb 6.4 oz (88.2 kg)     There are no preventive care reminders to display for this patient.  There are no preventive care reminders to display for this patient.  Lab Results  Component Value Date   TSH 1.381 04/23/2014   Lab Results  Component Value Date   WBC 8.6 04/23/2014   HGB 13.7 04/23/2014   HCT 40.5 04/23/2014   MCV 84.0 04/23/2014   PLT 245 04/23/2014   Lab Results  Component Value Date   NA 140 06/04/2019   K 4.5 06/04/2019   CO2 22 06/04/2019   GLUCOSE 111 (H) 06/04/2019   BUN 13 06/04/2019   CREATININE 0.63 06/04/2019   BILITOT <0.2 06/04/2019   ALKPHOS 98 06/04/2019   AST 20 06/04/2019   ALT 27 06/04/2019   PROT 6.8 06/04/2019   ALBUMIN 3.8 06/04/2019   CALCIUM 9.3 06/04/2019   Lab Results  Component Value Date   CHOL 190 06/04/2019   Lab Results  Component Value Date   HDL 48 06/04/2019   Lab Results  Component Value Date   LDLCALC 120 (H) 06/04/2019   Lab Results  Component Value Date   TRIG 120 06/04/2019   Lab Results  Component Value Date   CHOLHDL 4.0 06/04/2019   Lab Results  Component Value Date   HGBA1C 5.8 (A) 09/08/2019      Assessment & Plan:   Problem List Items Addressed This Visit   Michele Wright was seen today for annual exam.  Diagnoses and all orders  for this visit:  Prediabetes -     HgB A1c 5.8 Recommendations For Diabetic/Prediabetic Patients:  Decrease. Meat - red/white, Poultry and Dairy/especially cheese - Exercise at least 5 times a week for 30 minutes or preferably daily.  - No Smoking - Drink less  than 2 drinks a day.  - Monitor your feet for sores - Have yearly Eye Exams - Recommend annual Flu vaccine  - Recommend Pneumovax and Prevnar vaccines - Shingles Vaccine (Zostavax) if over 82 y.o. Goals:  - BMI less than 24 - Fasting sugar less than 130 or less than 150 if tapering medicines to lose weight  - Systolic BP less than 130  - Diastolic BP less than 80 - Bad LDL Cholesterol less than 70 - Triglycerides less than 150  Medication refill -     Fluticasone-Salmeterol (ADVAIR DISKUS) 500-50 MCG/DOSE AEPB; Inhale 1 puff into the lungs 2 (two) times daily. -     montelukast (SINGULAIR) 10 MG tablet; Take 1 tablet (10 mg total) by mouth at bedtime. -     albuterol (VENTOLIN HFA) 108 (90 Base) MCG/ACT inhaler; Inhale 2 puffs into the lungs every 4 (four) hours as needed for wheezing.  Annual physical exam The patient has been in otherwise good general health in the past and present . Prediabetes 5.8 elevated Bp will add lisinopril 5mg  dual purpose renal function and lower blood pressure.  Chronic pain of both knees Seen by ortho she explained they told her to follow up with them when the pain was unbearable and to try to loose weight per patient. -     Cancel: Ambulatory referral to Orthopedic Surgery  Mild intermittent chronic asthma without complication Controlled with  luticasone-Salmeterol (ADVAIR DISKUS) 500-50 MCG/DOSE AEPB; Inhale 1 puff into the lungs 2 (two) times daily. -     montelukast (SINGULAIR) 10 MG tablet; Take 1 tablet (10 mg total) by mouth at bedtime. -     albuterol (VENTOLIN HFA) 108 (90 Base) MCG/ACT inhaler; Inhale 2 puffs into the lungs every 4 (four) hours as needed for wheezing.   Healthcare  maintenance Need for prophylactic vaccination against Streptococcus pneumoniae (pneumococcus) CDC recommendations for pneumococcal vaccination to help prevent the spread of pneumococcal disease -     Pneumococcal polysaccharide vaccine 23-valent greater than or equal to 2yo subcutaneous/IM  Other orders -     diclofenac Sodium (VOLTAREN) 1 % GEL; Apply 4 g topically 4 (four) times daily. -     lisinopril (ZESTRIL) 5 MG tablet; Take 1 tablet (5 mg total) by mouth daily.   Meds ordered this encounter  Medications  . Fluticasone-Salmeterol (ADVAIR DISKUS) 500-50 MCG/DOSE AEPB    Sig: Inhale 1 puff into the lungs 2 (two) times daily.    Dispense:  60 each    Refill:  6    Please talk to patient about PASS program.  . montelukast (SINGULAIR) 10 MG tablet    Sig: Take 1 tablet (10 mg total) by mouth at bedtime.    Dispense:  30 tablet    Refill:  11    Please talk to patient about PASS program.  . albuterol (VENTOLIN HFA) 108 (90 Base) MCG/ACT inhaler    Sig: Inhale 2 puffs into the lungs every 4 (four) hours as needed for wheezing.    Dispense:  108 g    Refill:  1    Please talk to patient about PASS program.  . diclofenac Sodium (VOLTAREN) 1 % GEL    Sig: Apply 4 g topically 4 (four) times daily.    Dispense:  350 g    Refill:  3  . lisinopril (ZESTRIL) 5 MG tablet    Sig: Take 1 tablet (5 mg total) by mouth daily.    Dispense:  90 tablet  Refill:  1    Follow-up: Return in about 6 weeks (around 10/20/2019), or if symptoms worsen or fail to improve, for Bp follow in person.    Kerin Perna, NP

## 2019-09-08 NOTE — Patient Instructions (Addendum)
Dolor de rodilla crnico en los adultos Chronic Knee Pain, Adult El dolor de rodilla crnico es el dolor en una o en ambas rodillas que dura ms de 3 meses. Los sntomas de dolor de rodilla crnico pueden incluir hinchazn, rigidez y Tree surgeon. El desgaste de la articulacin de la rodilla (osteoartritis) relacionado con la edad es la causa ms frecuente de dolor de rodilla crnico. Otras causas posibles incluyen lo siguiente:  Una enfermedad a largo plazo relacionada con el sistema inmunitario que causa inflamacin de la rodilla (artritis reumatoide). Por lo general, afecta ambas rodillas.  Artritis inflamatoria, como gota o pseudogota.  Una lesin en la rodilla que causa artritis.  Una lesin en la rodilla que daa los ligamentos. Los ligamentos son tejidos fuertes que [employer] s.  Rodilla de corredor o dolor detrs de la rtula. El tratamiento para el dolor de rodilla crnico depende de su causa. Los tratamientos principales para el dolor de rodilla crnico son la fisioterapia y la prdida de [place]. Esta afeccin tambin puede tratarse con medicamentos, inyecciones, una rodillera o un dispositivo ortopdico, y el uso de [place]. Adems, puede recomendarse el tratamiento con reposo, hielo, compresin (presin) y elevacin (RHCE). Siga estas instrucciones en su casa: Si tiene una rodillera o un dispositivo ortopdico:   selos como se lo haya indicado el mdico. Quteselos solamente como se lo haya indicado el mdico.  Afljelos si siente hormigueo en los dedos de los pies, si se le entumecen, o se le enfran y se tornan de Optician, dispensing.  Mantngalos limpios.  Si la rodillera o el dispositivo ortopdico no son impermeables: ? No deje que se mojen. ? [place], si el mdico se lo permite, o cbralo con un envoltorio hermtico cuando tome un bao de inmersin o [place]. Control del dolor, la rigidez y la hinchazn      Si se lo indican, aplique calor en la zona afectada  con la frecuencia que le haya indicado el mdico. Use la fuente de calor que el mdico le recomiende, como una compresa de calor hmedo o una almohadilla trmica. ? Si [place] una rodillera o un dispositivo ortopdico, quteselo segn las indicaciones del mdico. ? Colquese una toalla entre la piel y la fuente de Freight forwarder. ? Aplique calor durante 20 a 64mnutos. ? Retire la fuente de calor si la piel se pone de color rojo brillante. Esto es especialmente importante si no puede sentir dolor, calor o fro. Puede correr un riesgo mayor de sufrir quemaduras.  Si se lo indican, aplique hielo sobre la zona afectada. ? Si uCanadauna rodillera o un dispositivo ortopdico, quteselo segn las indicaciones del mdico. ? Ponga el hielo en una bolsa plstica. ? Coloque una tGenuine Partspiel y lTherapist, nutritional ? Coloque el hielo durante 282mutos, 2 a 3veces por da.  Mueva los dedos del pie con frecuencia para reducir la rigidez y la hinchazn.  Cuando est sentado o acostado, levante (eleve) la zona lesionada por encima del nivel del corazn. Actividad  Evite las actividades en las que ambos pies no estn en contacto con el piso al mismo tiempo (actividades de alto impacto). Algunos ejemplos son correr, saArt therapistoga y hacer saltos de tijera.  Retome sus actividades normales segn lo indicado por el mdico. Pregntele al mdico qu actividades son seguras para usted.  Siga el plan de ejercicios que el mdico dise para usted. El mdico puede recomendarle lo siguiente: ? EvProduct/process development scientistas actividades que empeoren el dolor de rodilla.  Esto puede exigirle que modifique sus rutinas de ejercicio, participacin en deportes u obligaciones laborales. ? Usar calzado con suelas acolchonadas. ? Evitar las actividades o los deportes de alto impacto que requieran correr y [place] manager de direccin sbitamente. ? Realice fisioterapia como se lo haya indicado el mdico. La fisioterapia est planificada para satisfacer sus necesidades y  capacidades. Puede incluir ejercicios para la fuerza, la flexibilidad, la estabilidad y la resistencia. ? Haga ejercicios que mejoren el equilibrio y la fuerza, [place] tai chi y el yoga.  No apoye el peso del cuerpo [employer] extremidad [employer] que lo autorice el mdico. Use muletas, un bastn o un andador, segn se lo haya indicado el mdico. Instrucciones generales  Use los medicamentos de venta libre y los recetados solamente como se lo haya indicado el mdico.  Baje de peso si es necesario. Perder incluso un poco de peso puede reducir el dolor de rodilla. Pregntele al mdico cul es su peso ideal y cmo Horticulturist, commercial sin riesgos. Un experto en alimentacin (nutricionista) puede ayudarlo a planificar sus comidas.  No consuma ningn producto que contenga nicotina o tabaco, como cigarrillos, cigarrillos electrnicos y tabaco de Higher education careers adviser. Estos pueden retrasar la recuperacin. Si necesita ayuda para dejar de fumar, consulte al mdico.  Concurra a todas las visitas de seguimiento como se lo haya indicado el mdico. Esto es importante. Comunquese con un mdico si:  Tiene un dolor de rodilla que no mejora o que [place].  No puede realizar los ejercicios de fisioterapia debido al dolor de rodilla. Solicite ayuda inmediatamente si:  La rodilla se hincha y la hinchazn empeora.  No puede mover la rodilla.  Siente dolor intenso en la rodilla. Resumen  El dolor de rodilla que dura ms de 3 meses se considera dolor de rodilla crnico.  Los tratamientos principales para el dolor de rodilla crnico son la fisioterapia y la prdida de [place]. Tambin es posible que deba tomar medicamentos, usar una rodillera o un dispositivo ortopdico, usar muletas y aplicarse hielo o calor en la rodilla.  Perder incluso un poco de peso puede reducir el dolor de rodilla. Pregntele al mdico cul es su peso ideal y cmo Horticulturist, commercial sin riesgos. Un experto en alimentacin (nutricionista) puede ayudarlo a  planificar sus comidas.  Trabaje con un fisioterapeuta para crear un programa de ejercicios seguros, segn lo indique el mdico. Esta informacin no tiene Marine scientist el consejo del mdico. Asegrese de hacerle al mdico cualquier pregunta que tenga. Document Revised: 10/22/2018 Document Reviewed: 10/22/2018 Elsevier Patient Education  [place].  Hipertensin en los adultos Hypertension, Adult El trmino hipertensin es otra forma de denominar a la presin arterial elevada. La presin arterial elevada fuerza al corazn a trabajar ms para bombear la sangre. Esto puede causar problemas con el paso del [place]. Una lectura de presin arterial est compuesta por 2 nmeros. Hay un nmero superior (sistlico) sobre un nmero inferior (diastlico). Lo ideal es tener la presin arterial por debajo de 120/80. Las elecciones saludables pueden ayudar a Engineer, materials presin arterial, o tal vez necesite medicamentos para bajarla. Cules son las causas? Se desconoce la causa de esta afeccin. Algunas afecciones pueden estar relacionadas con la presin arterial alta. Qu incrementa el riesgo?  Fumar.  Tener diabetes mellitus tipo 2, colesterol alto, o ambos.  No hacer la cantidad suficiente de actividad fsica o ejercicio.  Tener sobrepeso.  Consumir mucha grasa, azcar, caloras o sal (sodio) en su dieta.  Beber alcohol en exceso.  Raynelle Jan  una enfermedad renal a largo plazo (crnica).  Tener antecedentes familiares de presin arterial alta.  Edad. Los riesgos aumentan con la edad.  [place]aza. El riesgo es mayor para las Retail banker.  Sexo. Antes de los 45aos, los hombres corren ms [employer]. Despus de los 65aos, las mujeres corren ms [employer].  Tener apnea obstructiva del sueo.  Estrs. Cules son los signos o los sntomas?  Es posible que la presin arterial alta puede no cause sntomas. La presin arterial muy alta (crisis  hipertensiva) puede provocar: ? Dolor de [place]. ? Sensaciones de preocupacin o nerviosismo (ansiedad). ? Falta de aire. ? Hemorragia nasal. ? Sensacin de Engineer, site (nuseas). ? Vmitos. ? Cambios en la forma de ver. ? Dolor muy intenso en el pecho. ? Convulsiones. Cmo se trata?  Esta afeccin se trata haciendo cambios saludables en el estilo de vida, por ejemplo: ? Consumir alimentos saludables. ? Hacer ms ejercicio. ? Beber menos alcohol.  El mdico puede recetarle medicamentos si los cambios en el estilo de vida no son suficientes para Child psychotherapist la presin arterial y si: ? El nmero de arriba est por encima de 130. ? El nmero de abajo est por encima de 80.  Su presin arterial personal ideal puede variar. Siga estas instrucciones en su casa: Comida y bebida   Si se lo dicen, siga el plan de alimentacin de DASH (Dietary Approaches to Stop Hypertension, Maneras de alimentarse para detener la hipertensin). Para seguir este plan: ? Llene la mitad del plato de cada comida con frutas y verduras. ? Llene un cuarto del plato de cada comida con cereales integrales. Los cereales integrales incluyen pasta integral, arroz integral y pan integral. ? Coma y beba productos lcteos con bajo contenido de grasa, como leche descremada o yogur bajo en grasas. ? Llene un cuarto del plato de cada comida con protenas bajas en grasa (magras). Las protenas bajas en grasa incluyen pescado, pollo sin piel, huevos, frijoles y tofu. ? Evite consumir carne grasa, carne curada y procesada, o pollo con piel. ? Evite consumir alimentos prehechos o procesados.  Consuma menos de 1500 mg de sal por da.  No beba alcohol si: ? El mdico le indica que no lo haga. ? Est embarazada, puede estar embarazada o est tratando de quedar embarazada.  Si bebe alcohol: ? Limite la cantidad que bebe a lo siguiente:  De 0 a 1 medida por da para las mujeres.  De 0 a 2 medidas por da  para los hombres. ? Est atento a la cantidad de alcohol que hay en las bebidas que toma. En los [place], una medida equivale a una botella de cerveza de 12oz (347m), un vaso de vino de 5oz (1463m o un vaso de una bebida alcohlica de alta graduacin de 1oz (4452m Estilo de vida   Trabaje con su mdico para mantenerse en un peso saludable o para perder peso. Pregntele a su mdico cul es el peso recomendable para usted.  Haga al menos 63m5mos de ejercicio la mayoHartford Financialla semaAtkinsontos pueden incluir caminar, nadar o andar en bicicleta.  Realice al menos 30 minutos de ejercicio que fortalezca sus msculos (ejercicios de resistencia) al menos 3 das a la semaPaxtontos pueden incluir levantar pesas o hacer [place].  No consuma ningn producto que contenga nicotina o tabaco, como cigarrillos, cigarrillos electrnicos y tabaco de mascHigher education careers adviser necesita ayuda para dejar de fumar, consulte al mdico.  Controle su presin arterial en su casa tal como le indic el mdico.  Concurra a todas las visitas de seguimiento como se lo haya indicado el mdico. Esto es importante. Medicamentos  [employer] de venta libre y los recetados solamente como se lo haya indicado el mdico. Siga cuidadosamente las indicaciones.  No omita las dosis de medicamentos para la presin arterial. Los medicamentos pierden eficacia si omite dosis. El hecho de omitir las dosis tambin [place] el riesgo de otros problemas.  Pregntele a su mdico a qu efectos secundarios o reacciones a los Careers information officer. Comunquese con un mdico si:  Piensa que tiene [place] reaccin a los medicamentos que est tomando.  Tiene dolores de cabeza frecuentes (recurrentes).  Se siente mareado.  Tiene hinchazn en los tobillos.  Tiene problemas de visin. Solicite ayuda inmediatamente si:  Siente un dolor de cabeza muy intenso.  Empieza a sentirse desorientado (confundido).  Se  siente dbil o adormecido.  Siente que va a desmayarse.  Tiene un dolor muy intenso en las siguientes zonas: ? Pecho. ? Vientre (abdomen).  Vomita ms de una vez.  Tiene dificultad para respirar. Resumen  El trmino hipertensin es otra forma de denominar a la presin arterial elevada.  La presin arterial elevada fuerza al corazn a trabajar ms para bombear la sangre.  Para la [employer], una presin arterial normal es menor que 120/80.  Las decisiones saludables pueden ayudarle a disminuir su presin arterial. Si no puede bajar su presin arterial mediante decisiones saludables, es posible que deba tomar medicamentos. Esta informacin no tiene Marine scientist el consejo del mdico. Asegrese de hacerle al mdico cualquier pregunta que tenga. Document Revised: 04/18/2018 Document Reviewed: 04/18/2018 Elsevier Patient Education  [place].

## 2019-10-23 ENCOUNTER — Ambulatory Visit (INDEPENDENT_AMBULATORY_CARE_PROVIDER_SITE_OTHER): Payer: Self-pay | Admitting: Primary Care

## 2019-10-23 ENCOUNTER — Encounter (INDEPENDENT_AMBULATORY_CARE_PROVIDER_SITE_OTHER): Payer: Self-pay | Admitting: Primary Care

## 2019-10-23 ENCOUNTER — Other Ambulatory Visit: Payer: Self-pay

## 2019-10-23 VITALS — BP 118/76 | HR 66 | Temp 96.1°F | Ht 61.0 in | Wt 198.2 lb

## 2019-10-23 DIAGNOSIS — T7840XA Allergy, unspecified, initial encounter: Secondary | ICD-10-CM

## 2019-10-23 DIAGNOSIS — J4520 Mild intermittent asthma, uncomplicated: Secondary | ICD-10-CM

## 2019-10-23 DIAGNOSIS — I10 Essential (primary) hypertension: Secondary | ICD-10-CM

## 2019-10-23 DIAGNOSIS — R519 Headache, unspecified: Secondary | ICD-10-CM

## 2019-10-23 DIAGNOSIS — Z760 Encounter for issue of repeat prescription: Secondary | ICD-10-CM

## 2019-10-23 MED ORDER — FLUTICASONE-SALMETEROL 500-50 MCG/DOSE IN AEPB: 1.0000 | INHALATION_SPRAY | Freq: Two times a day (BID) | RESPIRATORY_TRACT | 6 refills | Status: DC

## 2019-10-23 MED ORDER — FLUTICASONE PROPIONATE 50 MCG/ACT NA SUSP: 2.0000 | Freq: Every day | NASAL | 6 refills | Status: DC

## 2019-10-23 MED ORDER — MONTELUKAST SODIUM 10 MG PO TABS: 10.0000 mg | ORAL_TABLET | Freq: Every day | ORAL | 11 refills | Status: DC

## 2019-10-23 MED ORDER — LISINOPRIL 5 MG PO TABS: 5.0000 mg | ORAL_TABLET | Freq: Every day | ORAL | 1 refills | Status: DC

## 2019-10-23 MED ORDER — ALBUTEROL SULFATE HFA 108 (90 BASE) MCG/ACT IN AERS: 2.0000 | INHALATION_SPRAY | RESPIRATORY_TRACT | 1 refills | Status: DC | PRN

## 2019-10-23 MED ORDER — LORATADINE 10 MG PO TABS: 10.0000 mg | ORAL_TABLET | Freq: Every day | ORAL | 11 refills | Status: DC

## 2019-10-23 MED FILL — MONTELUKAST SOD 10 MG TAB: 10 | 30 days supply | Qty: 30 | Fill #0

## 2019-10-23 MED FILL — FLUTICASONE PROP 50 MCG SPR: 50 | 30 days supply | Qty: 16 | Fill #0

## 2019-10-23 MED FILL — ALBUTEROL SULFATE HFA 108 (: 108 (90 BAS | 25 days supply | Qty: 18 | Fill #0

## 2019-10-23 MED FILL — FLUTICASONE-SALMETEROL 500-: 500-50 | 30 days supply | Qty: 60 | Fill #0

## 2019-10-23 MED FILL — LISINOPRIL 5 MG TABLET: 5 | 30 days supply | Qty: 30 | Fill #0

## 2019-10-23 NOTE — Progress Notes (Signed)
Presents for  hypertension evaluation, on previous visit medication was adjusted to include lisinopri 5mg  daily. Denies any cough, or facial swelling.  Patient reports adherence with medications.  Current Medication List Current Outpatient Medications on File Prior to Visit  Medication Sig Dispense Refill  . albuterol (VENTOLIN HFA) 108 (90 Base) MCG/ACT inhaler Inhale 2 puffs into the lungs every 4 (four) hours as needed for wheezing. 108 g 1  . diclofenac Sodium (VOLTAREN) 1 % GEL Apply 4 g topically 4 (four) times daily. 350 g 3  . Fluticasone-Salmeterol (ADVAIR DISKUS) 500-50 MCG/DOSE AEPB Inhale 1 puff into the lungs 2 (two) times daily. 60 each 6  . lisinopril (ZESTRIL) 5 MG tablet Take 1 tablet (5 mg total) by mouth daily. 90 tablet 1  . montelukast (SINGULAIR) 10 MG tablet Take 1 tablet (10 mg total) by mouth at bedtime. 30 tablet 11   No current facility-administered medications on file prior to visit.   Past Medical History  Past Medical History:  Diagnosis Date  . Asthma    Dietary habits include: sodium restrictions  Exercise habits include: 3 times a week  Family / Social history: mother has a heart disease but doe not know what it is  ASCVD risk factors include-  O:  Physical Exam Vitals reviewed.  Constitutional:      Appearance: She is obese.  Cardiovascular:     Rate and Rhythm: Normal rate and regular rhythm.  Pulmonary:     Effort: Pulmonary effort is normal.     Breath sounds: Normal breath sounds.  Abdominal:     General: Bowel sounds are normal.  Musculoskeletal:        General: Normal range of motion.     Cervical back: Normal range of motion and neck supple.  Skin:    General: Skin is warm and dry.  Neurological:     Mental Status: She is alert and oriented to person, place, and time.  Psychiatric:        Mood and Affect: Mood normal.        Behavior: Behavior normal.        Thought Content: Thought content normal.        Judgment:  Judgment normal.      Review of Systems  Respiratory: Positive for cough and wheezing.        Dx asthma   Neurological: Positive for headaches.  Endo/Heme/Allergies: Positive for environmental allergies.  All other systems reviewed and are negative.   Last 3 Office BP readings: BP Readings from Last 3 Encounters:  10/23/19 118/76  09/08/19 (!) 141/87  01/07/19 126/86    BMET    Component Value Date/Time   NA 140 06/04/2019 0842   K 4.5 06/04/2019 0842   CL 104 06/04/2019 0842   CO2 22 06/04/2019 0842   GLUCOSE 111 (H) 06/04/2019 0842   GLUCOSE 88 04/23/2014 1013   BUN 13 06/04/2019 0842   CREATININE 0.63 06/04/2019 0842   CREATININE 0.62 04/23/2014 1013   CALCIUM 9.3 06/04/2019 0842   GFRNONAA 109 06/04/2019 0842   GFRNONAA >89 04/23/2014 1013   GFRAA 125 06/04/2019 0842   GFRAA >89 04/23/2014 1013    Renal function: CrCl cannot be calculated (Patient's most recent lab result is older than the maximum 21 days allowed.).  Clinical ASCVD: Yes The 10-year ASCVD risk score 06/23/2014 DC Jr., et al., 2013) is: 1.5%   Values used to calculate the score:     Age: 32 years  Sex: Female     Is Non-Hispanic African American: No     Diabetic: Yes     Tobacco smoker: No     Systolic Blood Pressure: 034 mmHg     Is BP treated: No     HDL Cholesterol: 48 mg/dL     Total Cholesterol: 190 mg/dL   A/P: Essential hypertension Hypertension newly diagnosed currently 118/76 on current medications. BP Goal = 130/80 mmHg. Patient is adherent with current medications.  -Continued Lisinopril   -F/u labs ordered - none -Counseled on lifestyle modifications for blood pressure control including reduced dietary sodium, increased exercise, adequate sleep -     lisinopril (ZESTRIL) 5 MG tablet; Take 1 tablet (5 mg total) by mouth daily.  Mild intermittent chronic asthma without complication -     albuterol (VENTOLIN HFA) 108 (90 Base) MCG/ACT inhaler; Inhale 2 puffs into the lungs  every 4 (four) hours as needed for wheezing. -     montelukast (SINGULAIR) 10 MG tablet; Take 1 tablet (10 mg total) by mouth at bedtime. -     Fluticasone-Salmeterol (ADVAIR DISKUS) 500-50 MCG/DOSE AEPB; Inhale 1 puff into the lungs 2 (two) times daily.  Allergy, initial encounter -     fluticasone (FLONASE) 50 MCG/ACT nasal spray; Place 2 sprays into both nostrils daily. -     loratadine (CLARITIN) 10 MG tablet; Take 1 tablet (10 mg total) by mouth daily.  Medication refill -     albuterol (VENTOLIN HFA) 108 (90 Base) MCG/ACT inhaler; Inhale 2 puffs into the lungs every 4 (four) hours as needed for wheezing. -     montelukast (SINGULAIR) 10 MG tablet; Take 1 tablet (10 mg total) by mouth at bedtime. -     Fluticasone-Salmeterol (ADVAIR DISKUS) 500-50 MCG/DOSE AEPB; Inhale 1 puff into the lungs 2 (two) times daily.

## 2019-10-23 NOTE — Progress Notes (Signed)
Pt has not had Bp medication this am

## 2019-10-23 NOTE — Patient Instructions (Signed)
Asma en los adultos Asthma, Adult  El asma es una afeccin a largo plazo (crnica) en la que las vas respiratorias se Engineer, technical sales y se obstruyen. Las vas respiratorias son los conductos que van desde la Clinical cytogeneticist y la boca hasta los pulmones. Una persona con asma pasar por momentos en que los sntomas empeoran. Estos se conocen como ataques de asma. Pueden causar tos, sonidos agudos al respirar (sibilancias), falta de aire y dolor en el pecho. Esto puede dificultar la respiracin. No hay una cura para el asma, pero los medicamentos y los cambios en el estilo de vida pueden ayudar a Theatre manager. Hay muchas cosas que pueden provocar un ataque de asma o intensificar los sntomas de la enfermedad (factores desencadenantes). Los factores desencadenantes comunes incluyen los siguientes:  Moho.  Polvo.  Humo del cigarrillo.  [place]ucarachas.  Cosas que causan sntomas de alergia (alrgenos). Estas incluyen escamas de la piel de los animales (caspa) y el polen de los pastos o los rboles.  Cosas que contaminan el aire. Entre ellas, productos de limpieza del hogar, humo de lea, niebla contaminada u olores qumicos.  Aire fro, cambios climticos y el viento.  Llanto o risa intensos.  Estrs.  Ciertos medicamentos o frmacos.  Ciertos alimentos, como frutas secas, papas fritas y [place].  Infecciones tales como los resfros o la gripe.  Ciertas enfermedades o afecciones.  Ejercicio o actividades extenuantes. El asma se puede tratar con medicamentos y mantenindose alejado de los factores que desencadenan los ataques de asma. Los medicamentos pueden incluir los siguientes:  Medicamentos de control. Estos ayudan a evitar los sntomas de asma. Generalmente, se toman [employer].  Medicamentos de [place] o de rescate de accin rpida. Estos alivian los sntomas de asma rpidamente. Se utilizan cuando es necesario y proporcionan alivio a Product manager.  Medicamentos para alergias si los ataques  son ocasionados por alrgenos.  Medicamentos para ayudar a Animator de defensa (inmunitario) del organismo. Siga estas instrucciones en su casa: Cmo evitar desencadenantes en su hogar  Cambie el filtro de la calefaccin y del aire acondicionado con frecuencia.  Limite el uso de hogares o estufas a lea.  Elimine las plagas (como cucarachas, ratones) y sus excrementos.  Deseche las plantas si observa moho en ellas.  Limpie los pisos. Elimine el polvo regularmente. Use productos de limpieza que sean inodoros.  Pdale a alguien que pase la aspiradora cuando usted no se encuentre en casa. Utilice una aspiradora con un filtro de aire de alta eficiencia (HEPA), siempre que sea posible.  Reemplace las alfombras por pisos de [place], baldosas o vinilo. Las alfombras pueden retener las escamas de la piel de los animales y [place].  Use almohadas, mantas y Psychologist, counselling.  Lave las sbanas y las mantas todas las semanas con agua caliente. Squelas en [place].  Mantenga su habitacin libre de desencadenantes.  Evite las [employer] y [employer] ventanas cerradas cuando en el aire haya cosas que causen sntomas de [place].  Use mantas de polister o algodn.  Limpie baos y cocinas con lavandina. Si fuera posible, pdale a alguien que vuelva a pintar las paredes de estas habitaciones con [place] pintura resistente a los hongos. Aljese de las habitaciones que se estn limpiando y pintando.  Lvese las manos frecuentemente con agua y [place]. Use desinfectante para manos si no dispone de [place] y [place].  No permita que nadie fume en su casa. Instrucciones generales  Use los medicamentos de venta libre y los  recetados solamente como se lo haya indicado el mdico. ? Hable con el mdico si tiene preguntas sobre cmo y cundo tomar sus medicamentos. ? Tome nota si necesita tomar sus medicamentos con ms frecuencia que lo usual.  No consuma ningn producto que contenga  nicotina o tabaco, como cigarrillos y cigarrillos electrnicos. Si necesita ayuda para dejar de consumir, consulte al mdico.  Aljese del humo de otros fumadores.  Evite realizar actividades al aire libre cuando la cantidad de alrgenos sea alta y la calidad del aire sea baja.  Use un pasamontaas al hacer actividades al Alexis Goodell en invierno. Este debe cubrir la nariz y la boca. [address] fros, ejerctese en interiores, de ser posible.  Entre en calor antes de hacer ejercicio. Dedique tiempo a enfriarse luego de Data processing manager fsicas.  Use un espirmetro como se lo haya indicado el mdico. El espirmetro es una herramienta que mide el funcionamiento de los pulmones.  Lleve un registro de los valores del espirmetro. Antelos.  Siga el plan de accin para el asma. Este es una planificacin por escrito para el control del asma y el tratamiento de los ataques.  Asegrese de recibir todas las vacunas que el mdico le recomiende. Consulte al [employer] vacunas para la gripe y la neumona.  Concurra a todas las visitas de seguimiento como se lo haya indicado el mdico. Esto es importante. Comunquese con un mdico si:  Tiene sibilancias, respira con dificultad o tose aun cuando toma medicamentos para prevenir ataques.  La mucosidad que expectora cuando tose (esputo) es ms espesa que lo habitual.  La mucosidad que expectora cambia de trasparente o blanco a amarillo, verde, gris o sanguinolenta.  Tiene trastornos a causa de los [employer] toma, por ejemplo: ? Erupcin cutnea. ? Picazn. ? Hinchazn. ? Dificultad para respirar.  Necesita un medicamento de alivio ms de 2 a 3veces por semana.  El valor de flujo mximo an est entre el 70 y el 79% del Pharmacist, hospital personal despus de seguir el plan de accin durante 1hora.  Tiene fiebre. Solicite ayuda inmediatamente si:  Futures trader y no responde al [employer] durante un ataque de asma.  Le  falta el aire, incluso en reposo.  Le falta el aire incluso cuando hace muy poca actividad fsica.  Tiene dificultad para comer, beber o hablar.  Siente dolor u opresin en el pecho.  Tiene latidos cardacos acelerados.  Los labios o las uas se le tornan [place].  Siente que est por desvanecerse, est mareado o se desmaya.  Su flujo mximo es menor que el 50% del Pharmacist, hospital personal.  Se siente demasiado cansado para respirar con normalidad. Resumen  El asma es una afeccin a largo plazo (crnica) en la que las vas respiratorias se Investment banker, corporate y se obstruyen. El ataque de asma puede causar dificultad para respirar.  El asma no puede curarse, pero los [employer] y los cambios en el estilo de vida lo ayudarn a Aeronautical engineer enfermedad.  Asegrese de comprender cmo evitar los desencadenantes y cmo y [employer]. Esta informacin no tiene Marine scientist el consejo del mdico. Asegrese de hacerle al mdico cualquier pregunta que tenga. Document Revised: 10/14/2018 Document Reviewed: 02/07/2017 Elsevier Patient Education  [place].

## 2019-12-08 MED FILL — !ADVAIR 500/50 DISKUS: 500-50 | 30 days supply | Qty: 60 | Fill #1

## 2019-12-08 MED FILL — LISINOPRIL 5 MG TABLET: 5 | 30 days supply | Qty: 30 | Fill #1

## 2019-12-08 MED FILL — $VENTOLIN HFA 18G INHALER: 108 (90 BAS | 16 days supply | Qty: 18 | Fill #1

## 2020-01-09 ENCOUNTER — Telehealth (INDEPENDENT_AMBULATORY_CARE_PROVIDER_SITE_OTHER): Payer: Self-pay | Admitting: Primary Care

## 2020-01-09 ENCOUNTER — Other Ambulatory Visit: Payer: Self-pay

## 2020-01-09 ENCOUNTER — Encounter (INDEPENDENT_AMBULATORY_CARE_PROVIDER_SITE_OTHER): Payer: Self-pay | Admitting: Primary Care

## 2020-01-09 DIAGNOSIS — Z760 Encounter for issue of repeat prescription: Secondary | ICD-10-CM

## 2020-01-09 DIAGNOSIS — T7840XA Allergy, unspecified, initial encounter: Secondary | ICD-10-CM

## 2020-01-09 DIAGNOSIS — I10 Essential (primary) hypertension: Secondary | ICD-10-CM

## 2020-01-09 DIAGNOSIS — J4520 Mild intermittent asthma, uncomplicated: Secondary | ICD-10-CM

## 2020-01-09 MED ORDER — LORATADINE 10 MG PO TABS: 10.0000 mg | ORAL_TABLET | Freq: Every day | ORAL | 11 refills | Status: DC

## 2020-01-09 MED ORDER — FLUTICASONE PROPIONATE 50 MCG/ACT NA SUSP: 2.0000 | Freq: Every day | NASAL | 6 refills | Status: DC

## 2020-01-09 MED ORDER — MONTELUKAST SODIUM 10 MG PO TABS: 10.0000 mg | ORAL_TABLET | Freq: Every day | ORAL | 11 refills | Status: DC

## 2020-01-09 MED ORDER — DICLOFENAC SODIUM 1 % EX GEL: 4.0000 g | Freq: Four times a day (QID) | CUTANEOUS | 3 refills | Status: DC

## 2020-01-09 MED ORDER — ALBUTEROL SULFATE HFA 108 (90 BASE) MCG/ACT IN AERS: 2.0000 | INHALATION_SPRAY | RESPIRATORY_TRACT | 1 refills | Status: DC | PRN

## 2020-01-09 MED ORDER — LISINOPRIL 5 MG PO TABS: 5.0000 mg | ORAL_TABLET | Freq: Every day | ORAL | 1 refills | Status: DC

## 2020-01-09 MED ORDER — FLUTICASONE-SALMETEROL 500-50 MCG/DOSE IN AEPB: 1.0000 | INHALATION_SPRAY | Freq: Two times a day (BID) | RESPIRATORY_TRACT | 6 refills | Status: DC

## 2020-01-09 MED FILL — FLUTICASONE-SALMETEROL 500-: 500-50 | 30 days supply | Qty: 60 | Fill #0

## 2020-01-09 MED FILL — MONTELUKAST SOD 10 MG TAB: 10 | 30 days supply | Qty: 30 | Fill #0

## 2020-01-09 MED FILL — $VENTOLIN HFA 18G INHALER: 108 (90 BAS | 16 days supply | Qty: 18 | Fill #0

## 2020-01-09 MED FILL — FLUTICASONE PROP 50 MCG SPR: 50 | 30 days supply | Qty: 16 | Fill #0

## 2020-01-09 MED FILL — LISINOPRIL 5 MG TABLET: 5 | 90 days supply | Qty: 90 | Fill #0

## 2020-01-09 MED FILL — DICLOFENAC SODIUM 1% GEL: 1 | 6 days supply | Qty: 100 | Fill #0

## 2020-01-09 NOTE — Progress Notes (Signed)
Pain in left side of near breast x 4 weeks Feels pain more when she breathes and touches area Pain 7/10   Menopause questions. Would like lab test to see where she is within this process.

## 2020-01-09 NOTE — Progress Notes (Addendum)
Virtual Visit via Telephone Note  I connected with Michele Wright on 01/09/20 at  8:30 AM EDT by telephone and verified that I am speaking with the correct person using two identifiers.   I discussed the limitations, risks, security and privacy concerns of performing an evaluation and management service by telephone and the availability of in person appointments. I also discussed with the patient that there may be a patient responsible charge related to this service. The patient expressed understanding and agreed to proceed. Patient location unknown Michele Wright in the office at [facility]   History of Present Illness: Michele Wright is a 44 year old female having a virtual visit for what to expect with  hot flashes and how to prevent them. Explained the absent of menstrual cycle for 1 year is the definition since she is still having menstrual cycle although may irregular she maybe perimenopausal.  Past Medical History:  Diagnosis Date  . Asthma    Current Outpatient Medications on File Prior to Visit  Medication Sig Dispense Refill  . albuterol (VENTOLIN HFA) 108 (90 Base) MCG/ACT inhaler Inhale 2 puffs into the lungs every 4 (four) hours as needed for wheezing. 108 g 1  . diclofenac Sodium (VOLTAREN) 1 % GEL Apply 4 g topically 4 (four) times daily. 350 g 3  . fluticasone (FLONASE) 50 MCG/ACT nasal spray Place 2 sprays into both nostrils daily. 16 g 6  . Fluticasone-Salmeterol (ADVAIR DISKUS) 500-50 MCG/DOSE AEPB Inhale 1 puff into the lungs 2 (two) times daily. 60 each 6  . lisinopril (ZESTRIL) 5 MG tablet Take 1 tablet (5 mg total) by mouth daily. 90 tablet 1  . loratadine (CLARITIN) 10 MG tablet Take 1 tablet (10 mg total) by mouth daily. 30 tablet 11  . montelukast (SINGULAIR) 10 MG tablet Take 1 tablet (10 mg total) by mouth at bedtime. 30 tablet 11   No current facility-administered medications on file prior to visit.    Observations/Objective: Review of Systems  All other systems reviewed and are negative.  Assessment and Plan: [name] was seen today for pain.  Diagnoses and all orders for this visit:  Essential hypertension Continue monitoring , low-sodium, DASH diet, medication compliance, 150 minutes of moderate intensity exercise per week. Discussed  Continuing -     lisinopril (ZESTRIL) 5 MG tablet; Take 1 tablet (5 mg total) by mouth daily.  Mild intermittent chronic asthma without complication Managed/controlled with medication listed below -     Fluticasone-Salmeterol (ADVAIR DISKUS) 500-50 MCG/DOSE AEPB; Inhale 1 puff into the lungs 2 (two) times daily. -     albuterol (VENTOLIN HFA) 108 (90 Base) MCG/ACT inhaler; Inhale 2 puffs into the lungs every 4 (four) hours as needed for wheezing. -     montelukast (SINGULAIR) 10 MG tablet; Take 1 tablet (10 mg total) by mouth at bedtime.  Medication refill -     Fluticasone-Salmeterol (ADVAIR DISKUS) 500-50 MCG/DOSE AEPB; Inhale 1 puff into the lungs 2 (two) times daily. -     albuterol (VENTOLIN HFA) 108 (90 Base) MCG/ACT inhaler; Inhale 2 puffs into the lungs every 4 (four) hours as needed for wheezing. -     montelukast (SINGULAIR) 10 MG tablet; Take 1 tablet (10 mg total) by mouth at bedtime.  Allergy, initial encounter -     fluticasone (FLONASE) 50 MCG/ACT nasal spray; Place 2 sprays into both nostrils daily. -     loratadine (CLARITIN) 10 MG tablet; Take 1 tablet (10 mg total) by mouth daily.  Other orders -     diclofenac Sodium (VOLTAREN) 1 % GEL; Apply 4 g topically 4 (four) times daily.    Follow Up Instructions:    I discussed the assessment and treatment plan with the patient. The patient was provided an opportunity to ask questions and all were answered. The patient agreed with the plan and demonstrated an understanding of the instructions.   The patient was advised to call back or seek an in-person evaluation if the symptoms  worsen or if the condition fails to improve as anticipated.  I provided 10 minutes of non-face-to-face time during this encounter.   Grayce Sessions, NP

## 2020-02-06 ENCOUNTER — Ambulatory Visit: Payer: Self-pay

## 2020-02-06 ENCOUNTER — Other Ambulatory Visit: Payer: Self-pay

## 2020-02-16 ENCOUNTER — Other Ambulatory Visit (INDEPENDENT_AMBULATORY_CARE_PROVIDER_SITE_OTHER): Payer: Self-pay | Admitting: Primary Care

## 2020-02-16 ENCOUNTER — Other Ambulatory Visit: Payer: Self-pay

## 2020-02-16 ENCOUNTER — Ambulatory Visit (INDEPENDENT_AMBULATORY_CARE_PROVIDER_SITE_OTHER): Payer: Self-pay | Admitting: Primary Care

## 2020-02-16 ENCOUNTER — Encounter (INDEPENDENT_AMBULATORY_CARE_PROVIDER_SITE_OTHER): Payer: Self-pay | Admitting: Primary Care

## 2020-02-16 VITALS — BP 123/81 | HR 65 | Temp 98.2°F | Ht 61.0 in | Wt 193.2 lb

## 2020-02-16 DIAGNOSIS — E7841 Elevated Lipoprotein(a): Secondary | ICD-10-CM

## 2020-02-16 DIAGNOSIS — M25561 Pain in right knee: Secondary | ICD-10-CM

## 2020-02-16 DIAGNOSIS — J4520 Mild intermittent asthma, uncomplicated: Secondary | ICD-10-CM

## 2020-02-16 DIAGNOSIS — L240 Irritant contact dermatitis due to detergents: Secondary | ICD-10-CM

## 2020-02-16 DIAGNOSIS — Z760 Encounter for issue of repeat prescription: Secondary | ICD-10-CM

## 2020-02-16 DIAGNOSIS — G8929 Other chronic pain: Secondary | ICD-10-CM

## 2020-02-16 DIAGNOSIS — I10 Essential (primary) hypertension: Secondary | ICD-10-CM

## 2020-02-16 DIAGNOSIS — M25562 Pain in left knee: Secondary | ICD-10-CM

## 2020-02-16 MED ORDER — FLUTICASONE PROPIONATE 50 MCG/ACT NA SUSP: 2.0000 | Freq: Every day | NASAL | 6 refills | Status: DC

## 2020-02-16 MED ORDER — LORATADINE 10 MG PO TABS: 10.0000 mg | ORAL_TABLET | Freq: Every day | ORAL | 11 refills | Status: DC

## 2020-02-16 MED ORDER — MONTELUKAST SODIUM 10 MG PO TABS: 10.0000 mg | ORAL_TABLET | Freq: Every day | ORAL | 11 refills | Status: DC

## 2020-02-16 MED ORDER — DICLOFENAC SODIUM 1 % EX GEL: 4.0000 g | Freq: Four times a day (QID) | CUTANEOUS | 3 refills | Status: DC

## 2020-02-16 MED ORDER — FLUTICASONE-SALMETEROL 500-50 MCG/DOSE IN AEPB: 1.0000 | INHALATION_SPRAY | Freq: Two times a day (BID) | RESPIRATORY_TRACT | 6 refills | Status: DC

## 2020-02-16 MED ORDER — ALBUTEROL SULFATE HFA 108 (90 BASE) MCG/ACT IN AERS
2.0000 | INHALATION_SPRAY | RESPIRATORY_TRACT | 1 refills | Status: DC | PRN
  Filled 2020-11-19: qty 18, 25d supply, fill #0

## 2020-02-16 MED ORDER — LISINOPRIL 5 MG PO TABS: 5.0000 mg | ORAL_TABLET | Freq: Every day | ORAL | 1 refills | Status: DC

## 2020-02-16 MED FILL — LISINOPRIL 5 MG TABLET: 5 | 90 days supply | Qty: 90 | Fill #1

## 2020-02-16 MED FILL — $VENTOLIN HFA 18G INHALER: 108 (90 BAS | 16 days supply | Qty: 18 | Fill #2

## 2020-02-16 NOTE — Patient Instructions (Signed)
Dermatitis de contacto Contact Dermatitis La dermatitis es el enrojecimiento, el dolor y la hinchazn (inflamacin) de la piel. La dermatitis de contacto es una reaccin a algo que toca la piel. Hay dos tipos de dermatitis de contacto:  Dermatitis de contacto irritativa. Esto ocurre cuando algo molesta (irrita) la piel, como el jabn.  Dermatitis de contacto alrgica. Esto se produce cuando una persona se expone a algo a lo que es alrgica, como la hiedra venenosa. Cules son las causas?  Las causas frecuentes de la dermatitis de contacto irritante incluyen las siguientes: ? Maquillaje. ? Jabones. ? Detergentes. ? Lavandina. ? cidos. ? Metales, como el nquel.  Las causas frecuentes de la dermatitis de contacto alrgica incluyen las siguientes: ? Plantas. ? Productos qumicos. ? Alhajas. ? Ltex. ? Medicamentos. ? Conservantes que se utilizan en determinados productos, como la ropa. Qu incrementa el riesgo?  Tener un trabajo que lo expone a cosas que causan molestias en la piel.  Tener asma o eczema. Cules son los signos o los sntomas? Los sntomas pueden ocurrir en cualquier parte de la piel que la sustancia irritante haya tocado. Algunos de los sntomas son los siguientes:  Piel seca o descamada.  Enrojecimiento.  Grietas.  Picazn.  Dolor o sensacin de ardor.  Ampollas.  Sangre o lquido transparente que drena de las grietas de la piel. En el caso de la dermatitis de contacto alrgica, puede haber hinchazn. Esto puede ocurrir en lugares como los prpados, la boca o los genitales. Cmo se trata?  Esta afeccin se trata mediante un control para detectar la causa de la reaccin y proteger la piel. El tratamiento tambin puede incluir lo siguiente: ? Ungentos, medicamentos o cremas con corticoesteroides. ? Antibiticos u otros ungentos, si tiene una infeccin en la piel. ? Lociones o medicamentos para aliviar la picazn. ? Una venda (vendaje). Siga  estas indicaciones en su casa: Cuidado de la piel  Humctese la piel segn sea necesario.  Aplique compresas fras sobre la piel.  Pngase una pasta de bicarbonato de sodio sobre la piel. Agregue agua al bicarbonato de sodio hasta que parezca una pasta.  No se rasque la piel.  Evite que las cosas le rocen la piel.  Evite el uso de jabones, perfumes y tintes. Medicamentos  Tome o aplique los medicamentos de venta libre y los recetados solamente como se lo haya indicado el mdico.  Si le recetaron un antibitico, tmelo o aplquelo como se lo haya indicado el mdico. No deje de usarlo aunque la afeccin empiece a mejorar. Baarse  Tome un bao con: ? Sales de [place]. ? Bicarbonato de sodio. ? Avena coloidal.  Bese con menos frecuencia.  Bese con agua tibia. No use agua caliente. Cuidado de la venda  Si le colocaron una venda, cmbiela como se lo haya indicado el mdico.  Lvese las manos con agua y jabn antes y despus de cambiarse la venda. Use desinfectante para manos si no dispone de agua y jabn. Indicaciones generales  Evite las cosas que le causaron la reaccin. Si no sabe qu la caus, lleve un diario. Escriba los siguientes datos: ? Lo que come. ? Los productos para la piel que usa. ? Lo que bebe. ? Lo que lleva puesto en la zona que tiene los sntomas. Esto incluye las alhajas.  Controle todos los das las zonas afectadas para detectar signos de infeccin. Est atento a los siguientes signos: ? Aumento del enrojecimiento, la hinchazn o el dolor. ? Ms lquido o sangre. ?   Calor. ? Pus o mal olor.  Concurra a todas las visitas de seguimiento como se lo haya indicado el mdico. Esto es importante. Comunquese con un mdico si:  No mejora con el tratamiento.  Su afeccin empeora.  Tiene signos de infeccin, como los siguientes: ? Aumento de la hinchazn. ? Dolor a la palpacin. ? Aumento del enrojecimiento. ? Molestias. ? Calor.  Tiene  fiebre.  Aparecen nuevos sntomas. Solicite ayuda inmediatamente si:  Tiene un dolor de cabeza muy intenso.  Siente dolor en el cuello.  Tiene el cuello rgido.  Vomita.  Se siente muy somnoliento.  Nota unas lneas rojas en la piel que salen de la zona.  El hueso o la articulacin que se encuentran cerca de la zona le duelen despus de que la piel se cura.  La zona se oscurece.  Tiene dificultad para respirar. Resumen  La dermatitis es el enrojecimiento, el dolor y la hinchazn de la piel.  Los sntomas pueden ocurrir en donde la sustancia irritante lo ha tocado.  El tratamiento puede incluir medicamentos y cuidado de la piel.  Si no conoce la causa de la reaccin alrgica, lleve un diario.  Comunquese con un mdico si su afeccin empeora o si tiene signos de infeccin. Esta informacin no tiene como fin reemplazar el consejo del mdico. Asegrese de hacerle al mdico cualquier pregunta que tenga. Document Revised: 03/06/2018 Document Reviewed: 03/06/2018 Elsevier Patient Education  2020 Elsevier Inc.  

## 2020-02-16 NOTE — Progress Notes (Signed)
Established Patient Office Visit  Subjective:  Patient ID: Michele Wright, female    DOB: [birth year]  Age: 44 y.o. MRN: [id number]  CC:  Chief Complaint  Patient presents with   Edema    ankles    HPI [name] iIs a 44 year old Hispanic female Michele Wright [id number]) presents for itching on her body. We discussed irritants  Contact dermatitis. After talking she changed her shampoo and the itching started. States she is feeling better that her swelling in her legs is gone. When she wears her compress hose no swelling but does not like wearing out.  Past Medical History:  Diagnosis Date   Asthma     Past Surgical History:  Procedure Laterality Date   NO PAST SURGERIES      Family History  Problem Relation Age of Onset   Breast cancer Sister     Social History   Socioeconomic History   Marital status: Single    Spouse name: Not on file   Number of children: Not on file   Years of education: Not on file   Highest education level: Not on file  Occupational History   Not on file  Tobacco Use   Smoking status: Never Smoker   Smokeless tobacco: Never Used  Substance and Sexual Activity   Alcohol use: Yes    Comment: occasionally   Drug use: No   Sexual activity: Not on file  Other Topics Concern   Not on file  Social History Narrative   Not on file   Social Determinants of Health   Financial Resource Strain:    Difficulty of Paying Living Expenses:   Food Insecurity:    Worried About Programme researcher, broadcasting/film/video in the Last Year:    Barista in the Last Year:   Transportation Needs:    Freight forwarder (Medical):    Lack of Transportation (Non-Medical):   Physical Activity:    Days of Exercise per Week:    Minutes of Exercise per Session:   Stress:    Feeling of Stress :   Social Connections:    Frequency of Communication with Friends and Family:    Frequency of Social Gatherings with Friends and Family:     Attends Religious Services:    Active Member of Clubs or Organizations:    Attends Engineer, structural:    Marital Status:   Intimate Partner Violence:    Fear of Current or Ex-Partner:    Emotionally Abused:    Physically Abused:    Sexually Abused:     Outpatient Medications Prior to Visit  Medication Sig Dispense Refill   albuterol (VENTOLIN HFA) 108 (90 Base) MCG/ACT inhaler Inhale 2 puffs into the lungs every 4 (four) hours as needed for wheezing. 108 g 1   diclofenac Sodium (VOLTAREN) 1 % GEL Apply 4 g topically 4 (four) times daily. 350 g 3   fluticasone (FLONASE) 50 MCG/ACT nasal spray Place 2 sprays into both nostrils daily. 16 g 6   Fluticasone-Salmeterol (ADVAIR DISKUS) 500-50 MCG/DOSE AEPB Inhale 1 puff into the lungs 2 (two) times daily. 60 each 6   lisinopril (ZESTRIL) 5 MG tablet Take 1 tablet (5 mg total) by mouth daily. 90 tablet 1   loratadine (CLARITIN) 10 MG tablet Take 1 tablet (10 mg total) by mouth daily. 30 tablet 11   montelukast (SINGULAIR) 10 MG tablet Take 1 tablet (10 mg total) by mouth at bedtime. 30 tablet 11  No facility-administered medications prior to visit.    No Known Allergies  ROS Review of Systems  Skin:       Contact dermatitis  All other systems reviewed and are negative.     Objective:    Physical Exam Vitals reviewed.  Constitutional:      Appearance: She is obese.  HENT:     Right Ear: Tympanic membrane normal.     Left Ear: Tympanic membrane normal.     Nose: Nose normal.  Eyes:     Extraocular Movements: Extraocular movements intact.     Pupils: Pupils are equal, round, and reactive to light.  Cardiovascular:     Rate and Rhythm: Normal rate and regular rhythm.     Pulses: Normal pulses.     Heart sounds: Normal heart sounds.  Pulmonary:     Effort: Pulmonary effort is normal.     Breath sounds: Normal breath sounds.  Abdominal:     General: Bowel sounds are normal.  Musculoskeletal:         General: Normal range of motion.     Cervical back: Normal range of motion and neck supple.  Skin:    General: Skin is warm and dry.  Neurological:     Mental Status: She is alert and oriented to person, place, and time.  Psychiatric:        Mood and Affect: Mood normal.        Behavior: Behavior normal.        Thought Content: Thought content normal.        Judgment: Judgment normal.     BP 123/81 (BP Location: Right Arm, Patient Position: Sitting, Cuff Size: Large)    Pulse 65    Temp 98.2 F (36.8 C) (Oral)    Ht 5\' 1"  (1.549 m)    Wt 193 lb 3.2 oz (87.6 kg)    LMP 01/26/2020 (Exact Date)    SpO2 97%    BMI 36.50 kg/m  Wt Readings from Last 3 Encounters:  02/16/20 193 lb 3.2 oz (87.6 kg)  10/23/19 198 lb 3.2 oz (89.9 kg)  09/08/19 199 lb 6.4 oz (90.4 kg)     Health Maintenance Due  Topic Date Due   Hepatitis C Screening  Never done   COVID-19 Vaccine (1) Never done   INFLUENZA VACCINE  02/15/2020    There are no preventive care reminders to display for this patient.  Lab Results  Component Value Date   TSH 1.381 04/23/2014   Lab Results  Component Value Date   WBC 8.6 04/23/2014   HGB 13.7 04/23/2014   HCT 40.5 04/23/2014   MCV 84.0 04/23/2014   PLT 245 04/23/2014   Lab Results  Component Value Date   NA 140 06/04/2019   K 4.5 06/04/2019   CO2 22 06/04/2019   GLUCOSE 111 (H) 06/04/2019   BUN 13 06/04/2019   CREATININE 0.63 06/04/2019   BILITOT <0.2 06/04/2019   ALKPHOS 98 06/04/2019   AST 20 06/04/2019   ALT 27 06/04/2019   PROT 6.8 06/04/2019   ALBUMIN 3.8 06/04/2019   CALCIUM 9.3 06/04/2019   Lab Results  Component Value Date   CHOL 190 06/04/2019   Lab Results  Component Value Date   HDL 48 06/04/2019   Lab Results  Component Value Date   LDLCALC 120 (H) 06/04/2019   Lab Results  Component Value Date   TRIG 120 06/04/2019   Lab Results  Component Value Date   CHOLHDL 4.0  06/04/2019   Lab Results  Component Value Date    HGBA1C 5.8 (A) 09/08/2019      Assessment & Plan:  Michele Wright was seen today for edema.  Diagnoses and all orders for this visit:  Irritant contact dermatitis due to detergent Changing of shampoo discussed a form of allergic reaction advised to get Benadryl 25 mg just to have on hand for allergic reactions  Mild intermittent chronic asthma without complication -     Fluticasone-Salmeterol (ADVAIR DISKUS) 500-50 MCG/DOSE AEPB; Inhale 1 puff into the lungs 2 (two) times daily. -     montelukast (SINGULAIR) 10 MG tablet; Take 1 tablet (10 mg total) by mouth at bedtime. -     albuterol (VENTOLIN HFA) 108 (90 Base) MCG/ACT inhaler; Inhale 2 puffs into the lungs every 4 (four) hours as needed for wheezing.  Medication refill -     Fluticasone-Salmeterol (ADVAIR DISKUS) 500-50 MCG/DOSE AEPB; Inhale 1 puff into the lungs 2 (two) times daily. -     montelukast (SINGULAIR) 10 MG tablet; Take 1 tablet (10 mg total) by mouth at bedtime. -     albuterol (VENTOLIN HFA) 108 (90 Base) MCG/ACT inhaler; Inhale 2 puffs into the lungs every 4 (four) hours as needed for wheezing.  Essential hypertension Well-controlled less than 130/80 today blood pressure is 123/81 continue to take medication as prescribed monitor sodium intake and exercise daily at least 30 minutes or 150 minutes weekly -     lisinopril (ZESTRIL) 5 MG tablet; Take 1 tablet (5 mg total) by mouth daily.  Chronic pain of both knees -     diclofenac Sodium (VOLTAREN) 1 % GEL; Apply 4 g topically 4 (four) times daily.  Other orders -     loratadine (CLARITIN) 10 MG tablet; Take 1 tablet (10 mg total) by mouth daily. -     fluticasone (FLONASE) 50 MCG/ACT nasal spray; Place 2 sprays into both nostrils daily.    Meds ordered this encounter  Medications   Fluticasone-Salmeterol (ADVAIR DISKUS) 500-50 MCG/DOSE AEPB    Sig: Inhale 1 puff into the lungs 2 (two) times daily.    Dispense:  60 each    Refill:  6    Please talk to patient about  PASS program.   loratadine (CLARITIN) 10 MG tablet    Sig: Take 1 tablet (10 mg total) by mouth daily.    Dispense:  30 tablet    Refill:  11   montelukast (SINGULAIR) 10 MG tablet    Sig: Take 1 tablet (10 mg total) by mouth at bedtime.    Dispense:  30 tablet    Refill:  11    Please talk to patient about PASS program.   albuterol (VENTOLIN HFA) 108 (90 Base) MCG/ACT inhaler    Sig: Inhale 2 puffs into the lungs every 4 (four) hours as needed for wheezing.    Dispense:  108 g    Refill:  1    Please talk to patient about PASS program.   fluticasone (FLONASE) 50 MCG/ACT nasal spray    Sig: Place 2 sprays into both nostrils daily.    Dispense:  16 g    Refill:  6   lisinopril (ZESTRIL) 5 MG tablet    Sig: Take 1 tablet (5 mg total) by mouth daily.    Dispense:  90 tablet    Refill:  1   diclofenac Sodium (VOLTAREN) 1 % GEL    Sig: Apply 4 g topically 4 (four) times daily.  Dispense:  350 g    Refill:  3    Follow-up: Return in about 6 months (around 08/18/2020) for in person DM.    Grayce SessionsMichelle P Kinley Dozier, NP

## 2020-02-17 MED FILL — FLUTICASONE-SALMETEROL 500-: 500-50 | 30 days supply | Qty: 60 | Fill #0

## 2020-02-17 MED FILL — MONTELUKAST SOD 10 MG TAB: 10 | 30 days supply | Qty: 30 | Fill #0

## 2020-02-17 MED FILL — DICLOFENAC SODIUM 1% GEL: 1 | 6 days supply | Qty: 100 | Fill #0

## 2020-02-17 MED FILL — FLUTICASONE PROP 50 MCG SPR: 50 | 30 days supply | Qty: 16 | Fill #0

## 2020-02-17 MED FILL — ?LORATADINE 10 MG TABS: 10 | 30 days supply | Qty: 30 | Fill #0

## 2020-03-05 ENCOUNTER — Other Ambulatory Visit: Payer: Self-pay | Admitting: Primary Care

## 2020-03-05 DIAGNOSIS — E7841 Elevated Lipoprotein(a): Secondary | ICD-10-CM

## 2020-03-05 MED FILL — LISINOPRIL 5 MG TABLET: 5 | 90 days supply | Qty: 90 | Fill #1

## 2020-03-05 MED FILL — FLUTICASONE-SALMETEROL 500-: 500-50 | 30 days supply | Qty: 60 | Fill #1

## 2020-03-31 ENCOUNTER — Other Ambulatory Visit: Payer: Self-pay | Admitting: Primary Care

## 2020-03-31 DIAGNOSIS — Z1231 Encounter for screening mammogram for malignant neoplasm of breast: Secondary | ICD-10-CM

## 2020-04-15 ENCOUNTER — Other Ambulatory Visit: Payer: Self-pay

## 2020-04-15 ENCOUNTER — Ambulatory Visit
Admission: RE | Admit: 2020-04-15 | Discharge: 2020-04-15 | Disposition: A | Discharge status: Home / Self Care | Payer: No Typology Code available for payment source | Source: Ambulatory Visit | Attending: Primary Care | Admitting: Primary Care

## 2020-04-15 DIAGNOSIS — Z1231 Encounter for screening mammogram for malignant neoplasm of breast: Secondary | ICD-10-CM

## 2020-04-23 ENCOUNTER — Ambulatory Visit (INDEPENDENT_AMBULATORY_CARE_PROVIDER_SITE_OTHER): Payer: No Typology Code available for payment source | Admitting: Primary Care

## 2020-04-28 ENCOUNTER — Encounter (INDEPENDENT_AMBULATORY_CARE_PROVIDER_SITE_OTHER): Payer: Self-pay

## 2020-05-06 ENCOUNTER — Telehealth: Payer: Self-pay

## 2020-05-06 NOTE — Telephone Encounter (Signed)
Called pt made aware of NP mamo results "Normal mammogram". Verbalized understanding  Copied from CRM [id number]. Topic: General - Other >> May 06, 2020  8:19 AM Gwenlyn Fudge wrote: Reason for CRM: Pt called and is requesting to have a call back to go over her mammogram results. Please advise.

## 2020-05-11 MED FILL — FLUTICASONE-SALMETEROL 500-: 500-50 | 30 days supply | Qty: 60 | Fill #2

## 2020-05-11 MED FILL — $VENTOLIN HFA 18G INHALER: 108 (90 BAS | 16 days supply | Qty: 18 | Fill #1

## 2020-06-08 MED FILL — $ADVAIR 500/50MCG INHALER: 500-50 | 90 days supply | Qty: 180 | Fill #3

## 2020-06-24 MED FILL — $ADVAIR 500/50MCG INHALER: 500-50 | 30 days supply | Qty: 60 | Fill #3

## 2020-07-30 MED FILL — $ADVAIR 500/50MCG INHALER: 500-50 | 30 days supply | Qty: 60 | Fill #4

## 2020-07-30 MED FILL — LISINOPRIL 5 MG TABLET: 5 | 30 days supply | Qty: 30 | Fill #0

## 2020-08-13 ENCOUNTER — Ambulatory Visit: Payer: Self-pay | Attending: Primary Care

## 2020-08-13 ENCOUNTER — Other Ambulatory Visit: Payer: Self-pay

## 2020-08-27 ENCOUNTER — Other Ambulatory Visit (INDEPENDENT_AMBULATORY_CARE_PROVIDER_SITE_OTHER): Payer: Self-pay | Admitting: Primary Care

## 2020-08-27 ENCOUNTER — Ambulatory Visit (INDEPENDENT_AMBULATORY_CARE_PROVIDER_SITE_OTHER): Payer: Self-pay | Admitting: Primary Care

## 2020-08-27 ENCOUNTER — Other Ambulatory Visit: Payer: Self-pay

## 2020-08-27 ENCOUNTER — Encounter (INDEPENDENT_AMBULATORY_CARE_PROVIDER_SITE_OTHER): Payer: Self-pay | Admitting: Primary Care

## 2020-08-27 VITALS — BP 129/81 | HR 64 | Temp 97.3°F | Ht 61.0 in | Wt 200.0 lb

## 2020-08-27 DIAGNOSIS — Z760 Encounter for issue of repeat prescription: Secondary | ICD-10-CM

## 2020-08-27 DIAGNOSIS — Z1211 Encounter for screening for malignant neoplasm of colon: Secondary | ICD-10-CM

## 2020-08-27 DIAGNOSIS — I10 Essential (primary) hypertension: Secondary | ICD-10-CM

## 2020-08-27 DIAGNOSIS — E7841 Elevated Lipoprotein(a): Secondary | ICD-10-CM

## 2020-08-27 DIAGNOSIS — R7303 Prediabetes: Secondary | ICD-10-CM

## 2020-08-27 LAB — POCT GLYCOSYLATED HEMOGLOBIN (HGB A1C): Hemoglobin A1C: 6.1 % — AB (ref 4.0–5.6)

## 2020-08-27 MED ORDER — LISINOPRIL 2.5 MG PO TABS: 5.0000 mg | ORAL_TABLET | Freq: Every day | ORAL | 1 refills | Status: DC

## 2020-08-27 MED FILL — LISINOPRIL 2.5 MG TABLET: 2.5 | 30 days supply | Qty: 30 | Fill #0

## 2020-08-27 NOTE — Patient Instructions (Signed)
Prueba de sangre oculta en heces Stool for Occult Blood Test Por qu me debo realizar esta prueba? La prueba de sangre oculta en heces (PSOH), o de sangre oculta en materia fecal, se utiliza para detectar una hemorragia gastrointestinal (GI), que puede ser un signo de cncer de colon. En esta prueba, tambin se pueden detectar pequeas cantidades de sangre en la materia fecal (heces) por otras causas, como lceras, sangrado de los vasos sanguneos o hemorroides. Esta prueba puede hacerse como parte de un examen de rutina anual para la deteccin del cncer colorrectal. Se recomienda que todos los adultos con riesgo promedio a partir de los 45aos y hasta los 75aos se hagan pruebas de deteccin. Qu se analiza? En esta prueba, se busca la presencia de sangre en las heces. Qu tipo de muestra se toma? Para esta prueba, se requiere una muestra de heces. El mdico puede recolectar la muestra mediante un hisopado del recto. O bien se le puede indicar que recolecte la muestra en un recipiente en su casa. Si debe recolectar la muestra en su casa, el mdico le dar las instrucciones y los materiales necesarios para hacerlo.   Cmo tomo las muestras en casa? Es posible que le pidan que recolecte muestras de heces en su casa. Siga las indicaciones del mdico acerca de cmo recolectar las muestras. Cuando junte la muestra de heces en su casa, asegrese de lo siguiente:  Use los elementos y siga las instrucciones que le dieron en el laboratorio.  Haga una deposicin directamente en un recipiente limpio y seco. No recolecte las heces del agua del inodoro.  Traspase la muestra al recipiente libre de microbios (estril) que le dieron en el laboratorio.  No deje que entre papel higinico ni orina en el recipiente.  Lvese las manos con agua y jabn despus de recolectar la muestra.  Asegrese de que la etiqueta de la muestra coincida con su nombre completo legal (no use apodos) y fecha de  nacimiento.  Asegrese de escribir la fecha y hora de recogida con claridad en la etiqueta con tinta que no se corra.  Entregue las muestras en el laboratorio segn las instrucciones que le hayan dado. Cmo debo prepararme para esta prueba?  No coma carne roja durante los 3 das previos a la prueba.  Siga las indicaciones del mdico con respecto a lo que puede comer y beber antes de la prueba. El mdico puede indicarle que evite ciertos alimentos o sustancias. Informe al mdico acerca de lo siguiente:  Todos los medicamentos que usa, incluidos vitaminas, hierbas, gotas oftlmicas, cremas y medicamentos de venta libre. Puede indicarle que evite ciertos medicamentos que se sabe que interfieren con este estudio.  Los procedimientos dentales que haya tenido recientemente. Cmo se informan los resultados? Los resultados de la prueba se informan como positivos o negativos para sangre en las heces. Qu significan los resultados? Un resultado negativo significa que no hay sangre presente en las heces. Un resultado negativo se considera normal. Un resultado positivo puede indicar la presencia de sangre en las heces. Las causas de la presencia de sangre en las heces incluyen lo siguiente:  Tumores GI.  Ciertas enfermedades GI.  Ciruga reciente o traumatismo GI.  Hemorroides. Si el resultado de la prueba es positivo, es posible que se necesiten otros estudios para encontrar la causa del sangrado. Hable con su mdico sobre lo que significan sus resultados. Preguntas para hacerle al mdico Consulte a su mdico o pregunte en el departamento donde se realiza la prueba   acerca de lo siguiente:  Cundo estarn disponibles mis resultados?  Cmo obtendr mis resultados?  Cules son las opciones de tratamiento?  Qu otras pruebas necesito?  Cules son los prximos pasos que debo seguir? Resumen  La prueba de sangre oculta en heces (PSOH), o de sangre oculta en materia fecal, se  utiliza para Arboriculturist hemorragia gastrointestinal (GI), que puede ser un signo de cncer de colon.  En esta prueba, tambin se pueden detectar pequeas cantidades de [employer] materia fecal (heces) por otras causas, como lceras, sangrado de los vasos sanguneos o hemorroides.  El mdico puede recolectar la muestra mediante un hisopado del recto. O bien se le puede indicar que recolecte la [place] en un recipiente en su casa.  Un resultado positivo puede indicar la presencia de [employer]. Esta informacin no tiene Theme park manager el consejo del mdico. Asegrese de hacerle al mdico cualquier pregunta que tenga. Document Revised: 05/06/2020 Document Reviewed: 05/06/2020 Elsevier Patient Education  2021 [employer].

## 2020-08-27 NOTE — Progress Notes (Signed)
Established Patient Office Visit  Subjective:  Patient ID: Michele Wright, female    DOB: December 28, 1975  Age: 45 y.o. MRN: [id number]  CC:  Chief Complaint  Patient presents with  . Prediabetes    HPI [name] is a 45 year old Hispanic female ( interpretor Michele Wright [phone]) presents for management of diabetes DIABETES Hypoglycemic episodes:no, Polydipsia/polyuria: no, Visual disturbance: no, Chest pain: no, Paresthesias: no, Glucose Monitoring: no. States she stopped taking cholesterol medication and questions if still needed. Medication ran out. Explained needed to call pharmacy or office for refills.    Past Medical History:  Diagnosis Date  . Asthma     Past Surgical History:  Procedure Laterality Date  . NO PAST SURGERIES      Family History  Problem Relation Age of Onset  . Breast cancer Sister   . Breast cancer Paternal Aunt     Social History   Socioeconomic History  . Marital status: Single    Spouse name: Not on file  . Number of children: Not on file  . Years of education: Not on file  . Highest education level: Not on file  Occupational History  . Not on file  Tobacco Use  . Smoking status: Never Smoker  . Smokeless tobacco: Never Used  Substance and Sexual Activity  . Alcohol use: Yes    Comment: occasionally  . Drug use: No  . Sexual activity: Not on file  Other Topics Concern  . Not on file  Social History Narrative  . Not on file   Social Determinants of Health   Financial Resource Strain: Not on file  Food Insecurity: Not on file  Transportation Needs: Not on file  Physical Activity: Not on file  Stress: Not on file  Social Connections: Not on file  Intimate Partner Violence: Not on file    Outpatient Medications Prior to Visit  Medication Sig Dispense Refill  . albuterol (VENTOLIN HFA) 108 (90 Base) MCG/ACT inhaler Inhale 2 puffs into the lungs every 4 (four) hours as needed for wheezing. (Patient not taking: Reported  on 08/27/2020) 108 g 1  . diclofenac Sodium (VOLTAREN) 1 % GEL Apply 4 g topically 4 (four) times daily. (Patient not taking: Reported on 08/27/2020) 350 g 3  . fluticasone (FLONASE) 50 MCG/ACT nasal spray Place 2 sprays into both nostrils daily. (Patient not taking: Reported on 08/27/2020) 16 g 6  . Fluticasone-Salmeterol (ADVAIR DISKUS) 500-50 MCG/DOSE AEPB Inhale 1 puff into the lungs 2 (two) times daily. (Patient not taking: Reported on 08/27/2020) 60 each 6  . loratadine (CLARITIN) 10 MG tablet Take 1 tablet (10 mg total) by mouth daily. (Patient not taking: Reported on 08/27/2020) 30 tablet 11  . montelukast (SINGULAIR) 10 MG tablet Take 1 tablet (10 mg total) by mouth at bedtime. (Patient not taking: Reported on 08/27/2020) 30 tablet 11  . lisinopril (ZESTRIL) 5 MG tablet Take 1 tablet (5 mg total) by mouth daily. (Patient not taking: Reported on 08/27/2020) 90 tablet 1   No facility-administered medications prior to visit.    No Known Allergies  ROS Review of Systems  Pertinent positive and negative noted in HPI   Objective:    Physical Exam Vitals reviewed.  Constitutional:      Appearance: She is obese.  HENT:     Head: Normocephalic.     Right Ear: External ear normal.     Left Ear: External ear normal.     Nose: Nose normal.  Eyes:  Extraocular Movements: Extraocular movements intact.  Cardiovascular:     Rate and Rhythm: Normal rate and regular rhythm.  Pulmonary:     Effort: Pulmonary effort is normal.     Breath sounds: Normal breath sounds.  Abdominal:     General: Bowel sounds are normal. There is distension.     Palpations: Abdomen is soft.  Musculoskeletal:        General: Normal range of motion.     Cervical back: Normal range of motion and neck supple.  Skin:    General: Skin is warm and dry.  Neurological:     Mental Status: She is alert.  Psychiatric:        Mood and Affect: Mood normal.        Thought Content: Thought content normal.         Judgment: Judgment normal.     BP 129/81 (BP Location: Right Arm, Patient Position: Sitting, Cuff Size: Large)   Pulse 64   Temp (!) 97.3 F (36.3 C) (Temporal)   Ht 5\' 1"  (1.549 m)   Wt 200 lb (90.7 kg)   LMP 08/25/2020 (Exact Date)   SpO2 95%   BMI 37.79 kg/m  Wt Readings from Last 3 Encounters:  08/27/20 200 lb (90.7 kg)  02/16/20 193 lb 3.2 oz (87.6 kg)  10/23/19 198 lb 3.2 oz (89.9 kg)     Health Maintenance Due  Topic Date Due  . COVID-19 Vaccine (1) Never done  . COLONOSCOPY (Pts 45-45yrs Insurance coverage will need to be confirmed)  Never done    There are no preventive care reminders to display for this patient.  Lab Results  Component Value Date   TSH 1.381 04/23/2014   Lab Results  Component Value Date   WBC 8.6 04/23/2014   HGB 13.7 04/23/2014   HCT 40.5 04/23/2014   MCV 84.0 04/23/2014   PLT 245 04/23/2014   Lab Results  Component Value Date   NA 140 06/04/2019   K 4.5 06/04/2019   CO2 22 06/04/2019   GLUCOSE 111 (H) 06/04/2019   BUN 13 06/04/2019   CREATININE 0.63 06/04/2019   BILITOT <0.2 06/04/2019   ALKPHOS 98 06/04/2019   AST 20 06/04/2019   ALT 27 06/04/2019   PROT 6.8 06/04/2019   ALBUMIN 3.8 06/04/2019   CALCIUM 9.3 06/04/2019   Lab Results  Component Value Date   CHOL 194 08/27/2020   Lab Results  Component Value Date   HDL 51 08/27/2020   Lab Results  Component Value Date   LDLCALC 121 (H) 08/27/2020   Lab Results  Component Value Date   TRIG 126 08/27/2020   Lab Results  Component Value Date   CHOLHDL 3.8 08/27/2020   Lab Results  Component Value Date   HGBA1C 6.1 (A) 08/27/2020      Assessment & Plan:  Michele Wright was seen today for prediabetes.  Diagnoses and all orders for this visit:  Prediabetes -     HgB A1c 6.1 previously 5.8  Monitor foods that are high in carbohydrates are the following tortillas  rice, potatoes, breads, sugars, and pastas.  Reduction in the intake (eating) will assist in lowering  your blood sugars.  Medication refill Lisinopril 2.5   Essential hypertension BP at goal on low dose of ACE for prediabetes . Decrease from 5mg  to 2.5mg    Elevated lipoprotein(a) Stopped statin will check lipids   Colon cancer screening FOBT     Meds ordered this encounter  Medications  .  lisinopril (ZESTRIL) 2.5 MG tablet    Sig: Take 2 tablets (5 mg total) by mouth daily.    Dispense:  90 tablet    Refill:  1    Follow-up: Return in about 6 months (around 02/24/2021) for prediabetes/ lipids in person .    Grayce Sessions, NP

## 2020-08-28 LAB — LIPID PANEL
Chol/HDL Ratio: 3.8 ratio (ref 0.0–4.4)
Cholesterol, Total: 194 mg/dL (ref 100–199)
HDL: 51 mg/dL (ref >39)
LDL Chol Calc (NIH): 121 mg/dL — ABNORMAL HIGH (ref 0–99)
Triglycerides: 126 mg/dL (ref 0–149)
VLDL Cholesterol Cal: 22 mg/dL (ref 5–40)

## 2020-09-02 ENCOUNTER — Other Ambulatory Visit (INDEPENDENT_AMBULATORY_CARE_PROVIDER_SITE_OTHER): Payer: Self-pay | Admitting: Primary Care

## 2020-09-02 ENCOUNTER — Other Ambulatory Visit (INDEPENDENT_AMBULATORY_CARE_PROVIDER_SITE_OTHER): Payer: Self-pay

## 2020-09-02 DIAGNOSIS — Z1211 Encounter for screening for malignant neoplasm of colon: Secondary | ICD-10-CM

## 2020-09-02 DIAGNOSIS — E7800 Pure hypercholesterolemia, unspecified: Secondary | ICD-10-CM

## 2020-09-02 MED ORDER — ATORVASTATIN CALCIUM 10 MG PO TABS: 10.0000 mg | ORAL_TABLET | Freq: Every day | ORAL | 3 refills | Status: DC

## 2020-09-02 MED FILL — ?ATORVASTATIN 10 MG TABLET: 10 | 30 days supply | Qty: 30 | Fill #0

## 2020-09-03 LAB — FECAL OCCULT BLOOD, IMMUNOCHEMICAL: Fecal Occult Bld: NEGATIVE

## 2020-09-10 ENCOUNTER — Telehealth (INDEPENDENT_AMBULATORY_CARE_PROVIDER_SITE_OTHER): Payer: Self-pay

## 2020-09-10 NOTE — Telephone Encounter (Signed)
Call placed to patient with assistance of pacific interpreter [phone]) patient aware that cholesterol is elevated. She had been taking medication during the day. Advised to take at night. Also advised on dietary changes. She is aware of negative fecal occult. Michele Wright, CMA

## 2020-09-10 NOTE — Telephone Encounter (Signed)
-----   Message from Grayce Sessions, NP sent at 09/02/2020  9:21 AM EST ----- Your LDL is not in range. Your LDL is the bad cholesterol that can lead to heart attack and stroke. To lower your number you can decrease your fatty foods, red meat, cheese, milk and increase fiber like whole grains and veggies. Also recommended diabetic are on cholesterol medication . Sent in atorvastatin 10mg  take at bedtime

## 2020-09-23 MED FILL — LISINOPRIL 2.5 MG TABLET: 2.5 | 30 days supply | Qty: 30 | Fill #1

## 2020-09-27 MED FILL — ?ATORVASTATIN 10 MG TABLET: 10 | 30 days supply | Qty: 30 | Fill #1

## 2020-10-11 MED FILL — !VENTOLIN HFA INHALER: 108 (90 BAS | 16 days supply | Qty: 18 | Fill #0

## 2020-10-11 MED FILL — $ADVAIR 500/50MCG INHALER: 500-50 | 60 days supply | Qty: 120 | Fill #5

## 2020-10-14 MED FILL — LISINOPRIL 2.5 MG TABLET: 2.5 | 30 days supply | Qty: 60 | Fill #2

## 2020-11-12 ENCOUNTER — Other Ambulatory Visit: Payer: Self-pay

## 2020-11-19 ENCOUNTER — Other Ambulatory Visit (INDEPENDENT_AMBULATORY_CARE_PROVIDER_SITE_OTHER): Payer: Self-pay | Admitting: Primary Care

## 2020-11-19 ENCOUNTER — Other Ambulatory Visit: Payer: Self-pay

## 2020-11-19 MED ORDER — FLUTICASONE-SALMETEROL 500-50 MCG/ACT IN AEPB: 1.0000 | INHALATION_SPRAY | Freq: Two times a day (BID) | RESPIRATORY_TRACT | 11 refills | Status: DC

## 2020-11-19 MED FILL — Atorvastatin Calcium Tab 10 MG (Base Equivalent): ORAL | 30 days supply | Qty: 30 | Fill #0 | Status: AC

## 2020-11-19 MED FILL — Lisinopril Tab 2.5 MG: ORAL | 30 days supply | Qty: 30 | Fill #0 | Status: AC

## 2020-11-24 ENCOUNTER — Other Ambulatory Visit: Payer: Self-pay

## 2020-12-02 ENCOUNTER — Other Ambulatory Visit: Payer: Self-pay

## 2020-12-17 ENCOUNTER — Other Ambulatory Visit: Payer: Self-pay

## 2020-12-17 MED FILL — Lisinopril Tab 2.5 MG: ORAL | 30 days supply | Qty: 30 | Fill #1 | Status: AC

## 2020-12-17 MED FILL — Atorvastatin Calcium Tab 10 MG (Base Equivalent): ORAL | 30 days supply | Qty: 30 | Fill #1 | Status: AC

## 2021-01-27 ENCOUNTER — Other Ambulatory Visit: Payer: Self-pay

## 2021-02-08 ENCOUNTER — Other Ambulatory Visit: Payer: Self-pay

## 2021-02-08 ENCOUNTER — Other Ambulatory Visit (INDEPENDENT_AMBULATORY_CARE_PROVIDER_SITE_OTHER): Payer: Self-pay | Admitting: Primary Care

## 2021-02-08 DIAGNOSIS — I10 Essential (primary) hypertension: Secondary | ICD-10-CM

## 2021-02-08 DIAGNOSIS — Z760 Encounter for issue of repeat prescription: Secondary | ICD-10-CM

## 2021-02-08 MED ORDER — LISINOPRIL 2.5 MG PO TABS
ORAL_TABLET | ORAL | 1 refills | Status: DC
  Filled 2021-02-08: qty 30, 30d supply, fill #0

## 2021-02-08 MED ORDER — FLUTICASONE-SALMETEROL 500-50 MCG/ACT IN AEPB
1.0000 | INHALATION_SPRAY | Freq: Two times a day (BID) | RESPIRATORY_TRACT | 5 refills | Status: DC
  Filled 2021-02-08: qty 60, 30d supply, fill #0
  Filled 2021-12-23: qty 180, 90d supply, fill #0
  Filled 2021-12-27: qty 60, 30d supply, fill #0

## 2021-02-08 MED FILL — Atorvastatin Calcium Tab 10 MG (Base Equivalent): ORAL | 30 days supply | Qty: 30 | Fill #2 | Status: AC

## 2021-07-05 ENCOUNTER — Ambulatory Visit: Payer: No Typology Code available for payment source | Attending: Primary Care

## 2021-07-05 ENCOUNTER — Other Ambulatory Visit: Payer: Self-pay

## 2021-07-08 ENCOUNTER — Ambulatory Visit (INDEPENDENT_AMBULATORY_CARE_PROVIDER_SITE_OTHER): Payer: No Typology Code available for payment source | Admitting: Primary Care

## 2021-08-31 ENCOUNTER — Other Ambulatory Visit: Payer: Self-pay | Admitting: Primary Care

## 2021-08-31 DIAGNOSIS — Z1231 Encounter for screening mammogram for malignant neoplasm of breast: Secondary | ICD-10-CM

## 2021-09-15 ENCOUNTER — Ambulatory Visit
Admission: RE | Admit: 2021-09-15 | Discharge: 2021-09-15 | Disposition: A | Discharge status: Home / Self Care | Payer: No Typology Code available for payment source | Source: Ambulatory Visit | Attending: Primary Care | Admitting: Primary Care

## 2021-09-15 ENCOUNTER — Other Ambulatory Visit: Payer: Self-pay

## 2021-09-15 DIAGNOSIS — Z1231 Encounter for screening mammogram for malignant neoplasm of breast: Secondary | ICD-10-CM

## 2021-12-23 ENCOUNTER — Other Ambulatory Visit: Payer: Self-pay

## 2021-12-23 ENCOUNTER — Other Ambulatory Visit (INDEPENDENT_AMBULATORY_CARE_PROVIDER_SITE_OTHER): Payer: Self-pay | Admitting: Primary Care

## 2021-12-23 DIAGNOSIS — J4520 Mild intermittent asthma, uncomplicated: Secondary | ICD-10-CM

## 2021-12-23 DIAGNOSIS — Z760 Encounter for issue of repeat prescription: Secondary | ICD-10-CM

## 2021-12-23 DIAGNOSIS — E7800 Pure hypercholesterolemia, unspecified: Secondary | ICD-10-CM

## 2021-12-27 ENCOUNTER — Other Ambulatory Visit: Payer: Self-pay

## 2022-03-02 ENCOUNTER — Other Ambulatory Visit: Payer: Self-pay

## 2022-03-02 ENCOUNTER — Other Ambulatory Visit (INDEPENDENT_AMBULATORY_CARE_PROVIDER_SITE_OTHER): Payer: Self-pay | Admitting: Primary Care

## 2022-03-02 DIAGNOSIS — I10 Essential (primary) hypertension: Secondary | ICD-10-CM

## 2022-03-02 DIAGNOSIS — J4520 Mild intermittent asthma, uncomplicated: Secondary | ICD-10-CM

## 2022-03-02 DIAGNOSIS — E7800 Pure hypercholesterolemia, unspecified: Secondary | ICD-10-CM

## 2022-03-02 DIAGNOSIS — Z760 Encounter for issue of repeat prescription: Secondary | ICD-10-CM

## 2022-03-24 ENCOUNTER — Ambulatory Visit (INDEPENDENT_AMBULATORY_CARE_PROVIDER_SITE_OTHER): Payer: Self-pay | Admitting: Primary Care

## 2022-03-24 ENCOUNTER — Encounter (INDEPENDENT_AMBULATORY_CARE_PROVIDER_SITE_OTHER): Payer: Self-pay | Admitting: Primary Care

## 2022-03-24 ENCOUNTER — Other Ambulatory Visit: Payer: Self-pay

## 2022-03-24 VITALS — BP 117/82 | HR 62 | Temp 98.1°F | Ht 61.0 in | Wt 199.2 lb

## 2022-03-24 DIAGNOSIS — Z6837 Body mass index (BMI) 37.0-37.9, adult: Secondary | ICD-10-CM

## 2022-03-24 DIAGNOSIS — E6609 Other obesity due to excess calories: Secondary | ICD-10-CM

## 2022-03-24 DIAGNOSIS — Z23 Encounter for immunization: Secondary | ICD-10-CM

## 2022-03-24 DIAGNOSIS — J4520 Mild intermittent asthma, uncomplicated: Secondary | ICD-10-CM

## 2022-03-24 DIAGNOSIS — Z760 Encounter for issue of repeat prescription: Secondary | ICD-10-CM

## 2022-03-24 DIAGNOSIS — R7303 Prediabetes: Secondary | ICD-10-CM

## 2022-03-24 LAB — POCT GLYCOSYLATED HEMOGLOBIN (HGB A1C): HbA1c, POC (controlled diabetic range): 6.3 % (ref 0.0–7.0)

## 2022-03-24 MED ORDER — FLUTICASONE PROPIONATE 50 MCG/ACT NA SUSP
2.0000 | Freq: Every day | NASAL | 6 refills | Status: AC
  Filled 2022-03-24: qty 16, 30d supply, fill #0

## 2022-03-24 MED ORDER — FLUTICASONE-SALMETEROL 500-50 MCG/ACT IN AEPB
1.0000 | INHALATION_SPRAY | Freq: Two times a day (BID) | RESPIRATORY_TRACT | 5 refills | Status: DC
  Filled 2022-03-24: qty 60, 30d supply, fill #0

## 2022-03-24 MED ORDER — LORATADINE 10 MG PO TABS
10.0000 mg | ORAL_TABLET | Freq: Every day | ORAL | 11 refills | Status: DC
  Filled 2022-03-24: qty 30, 30d supply, fill #0

## 2022-03-24 MED ORDER — MONTELUKAST SODIUM 10 MG PO TABS
10.0000 mg | ORAL_TABLET | Freq: Every day | ORAL | 11 refills | Status: DC
  Filled 2022-03-24: qty 30, 30d supply, fill #0
  Filled 2022-11-23: qty 30, 30d supply, fill #1

## 2022-03-24 NOTE — Progress Notes (Signed)
Established Patient Office Visit  Subjective   Patient ID: Michele Wright, female    DOB: 1976-07-15  Age: 46 y.o. MRN: [id number]   HPI Michele Wright is a 46 year old obese Hispanic female(interpreter Sam [phone]) she presents today for follow-up on prediabetes and requesting refills on medications.She does have a cough that started yesterday no fever no chills. Patient has No headache, No chest pain, No abdominal pain - No Nausea, No new weakness tingling or numbness, No Cough - shortness of breath  Patient Active Problem List   Diagnosis Date Noted   Chronic pain of both knees 01/09/2018   Right medial knee pain 11/11/2015   Peripheral edema 11/11/2015   Heel pain, bilateral 01/14/2014   Asthma, chronic 04/03/2013   Past Medical History:  Diagnosis Date   Asthma    Past Surgical History:  Procedure Laterality Date   NO PAST SURGERIES     Social History   Tobacco Use   Smoking status: Never   Smokeless tobacco: Never  Substance Use Topics   Alcohol use: Yes    Comment: occasionally   Drug use: No   No Known Allergies    ROS Comprehensive ROS Pertinent positive and negative noted in HPI     Objective:    BP 117/82   Pulse 62   Temp 98.1 F (36.7 C) (Oral)   Ht _0  (1.549 m)   Wt 199 lb 3.2 oz (90.4 kg)   SpO2 96%   BMI 37.64 kg/m  BP Readings from Last 3 Encounters:  03/24/22 117/82  08/27/20 129/81  02/16/20 123/81    Physical Exam Vitals reviewed.  Constitutional:      Appearance: She is obese.  HENT:     Head: Normocephalic.     Right Ear: Tympanic membrane and external ear normal.     Left Ear: Tympanic membrane and external ear normal.     Nose: Nose normal.  Eyes:     Extraocular Movements: Extraocular movements intact.     Pupils: Pupils are equal, round, and reactive to light.  Cardiovascular:     Rate and Rhythm: Normal rate and regular rhythm.  Pulmonary:     Effort: Pulmonary effort is normal.     Breath sounds:  Normal breath sounds.  Abdominal:     General: Bowel sounds are normal.     Palpations: Abdomen is soft.  Musculoskeletal:        General: Normal range of motion.     Cervical back: Normal range of motion and neck supple.  Skin:    General: Skin is warm and dry.  Neurological:     Mental Status: She is alert and oriented to person, place, and time.  Psychiatric:        Mood and Affect: Mood normal.        Behavior: Behavior normal.     Results for orders placed or performed in visit on 03/24/22  POCT glycosylated hemoglobin (Hb A1C)  Result Value Ref Range   Hemoglobin A1C     HbA1c POC (<> result, manual entry)     HbA1c, POC (prediabetic range)     HbA1c, POC (controlled diabetic range) 6.3 0.0 - 7.0 %    The 10-year ASCVD risk score (Arnett DK, et al., 2019) is: 1.6%    Assessment & Plan:  Diagnoses and all orders for this visit:  Prediabetes -     POCT glycosylated hemoglobin (Hb A1C) -     CBC with Differential -  CMP14+EGFR  Mild intermittent chronic asthma without complication -     montelukast (SINGULAIR) 10 MG tablet; Take 1 tablet (10 mg total) by mouth at bedtime. -     fluticasone-salmeterol (ADVAIR DISKUS) 500-50 MCG/ACT AEPB; Inhale 1 puff into the lungs 2 (two) times daily  Medication refill -     montelukast (SINGULAIR) 10 MG tablet; Take 1 tablet (10 mg total) by mouth at bedtime. -     loratadine (CLARITIN) 10 MG tablet; Take 1 tablet (10 mg total) by mouth daily. -     fluticasone-salmeterol (ADVAIR DISKUS) 500-50 MCG/ACT AEPB; Inhale 1 puff into the lungs 2 (two) times daily -     fluticasone (FLONASE) 50 MCG/ACT nasal spray; Place 2 sprays into both nostrils daily.  Class 2 obesity due to excess calories without serious comorbidity with body mass index (BMI) of 37.0 to 37.9 in adult Obesity is 30-39 indicating an excess in caloric intake or underlining conditions. This may lead to other co-morbidities. Lifestyle modifications of diet and  exercise may reduce obesity.   -     Lipid Panel     Return in about 6 months (around 09/22/2022) for Prediabetes .    Kerin Perna, NP

## 2022-03-24 NOTE — Patient Instructions (Addendum)
Recuento de caloras para bajar de peso Calorie Counting for [employer] Loss Las caloras son unidades de Teacher, early years/pre. El cuerpo necesita una cierta cantidad de caloras de los alimentos para que lo ayuden a funcionar durante todo Games developer. Cuando se comen o beben ms caloras de las que el cuerpo [place], este acumula las caloras adicionales mayormente como grasa. Cuando se comen o beben menos caloras de las que el cuerpo [place], este quema grasa para obtener la energa que necesita. El recuento de caloras es el registro de la cantidad de caloras que se comen y English as a second language teacher. El recuento de caloras puede ser de ayuda si necesita perder peso. Si come menos caloras de las que el cuerpo necesita, debera bajar de [place]. Pregntele al mdico cul es un peso sano para usted. Para que el recuento de caloras funcione, usted tendr que ingerir la cantidad de caloras adecuadas cada da, para bajar una cantidad de peso saludable por semana. Un nutricionista puede ayudar a determinar la cantidad de caloras que usted necesita por da y sugerirle formas de Science writer su objetivo calrico. Ardelia Mems cantidad de peso saludable para bajar cada semana suele ser entre 1 y 2 libras (0.5 a 0.9 kg). Esto habitualmente significa que su ingesta diaria de caloras se debera reducir en unas 500 a 750 caloras. Ingerir de 1200 a 1500 caloras por Administrator, Civil Service a la [employer] de las mujeres a Sports coach de [place]. Ingerir de 1500 a 1800 caloras por Administrator, Civil Service a la [employer] de los hombres a Sports coach de [place]. Qu debo saber acerca del recuento de caloras? Trabaje con el mdico o el nutricionista para determinar cuntas caloras debe recibir Armed forces operational officer. A fin de alcanzar su objetivo diario de caloras, tendr que: Averiguar cuntas caloras hay en cada alimento que le Therapist, occupational. Intente hacerlo antes de comer. Decidir la cantidad que puede comer del alimento. Llevar un registro de los alimentos. Para esto, anote lo que comi y cuntas  caloras tena. Para perder peso con xito, es importante equilibrar el recuento de caloras con un estilo de vida saludable que incluya actividad fsica de forma regular. Dnde encuentro informacin sobre las caloras?  Es posible Animator cantidad de caloras que contiene un alimento en la etiqueta de informacin nutricional. Si un alimento no tiene una etiqueta de informacin nutricional, intente buscar las caloras en Internet o pida ayuda al nutricionista. Recuerde que las caloras se calculan por porcin. Si opta por comer ms de una porcin de un alimento, tendr [employer] las caloras de una porcin por la cantidad de porciones que planea comer. Por ejemplo, la etiqueta de un envase de pan puede decir que el tamao de una porcin es 1 rodaja, y que una porcin tiene 90 caloras. Si come 1 rodaja, habr comido 90 caloras. Si come 2 rodajas, habr comido 180 caloras. Cmo llevo un registro de comidas? Despus de cada vez que coma, anote lo siguiente en el registro de alimentos lo antes posible: Lo que comi. Asegrese de [employer], las salsas y otros extras [employer] La cantidad que comi. Esto se puede medir en tazas, onzas o cantidad de alimentos. Cuntas caloras haba en cada alimento y en cada bebida. La cantidad total de caloras en la comida que tom. Tenga a Materials engineer de alimentos, por ejemplo, en un anotador de bolsillo o utilice una aplicacin o sitio web en el telfono mvil. Algunos programas calcularn las caloras por usted y Automotive engineer la cantidad de  caloras que le quedan para llegar al objetivo diario. Cules son algunos consejos para controlar las porciones? Sepa cuntas caloras hay en una porcin. Esto lo ayudar a saber cuntas porciones de un alimento determinado puede comer. Use una taza medidora para medir los tamaos de las porciones. Tambin puede intentar pesar las porciones en una balanza de cocina. Con el tiempo, podr hacer  un clculo estimativo de los tamaos de las porciones de algunos alimentos. Dedique tiempo a poner porciones de diferentes alimentos en sus platos, tazones y tazas predilectos, a fin de saber cmo se ve una porcin. Intente no comer directamente de un envase de alimentos, por ejemplo, de una bolsa o una caja. Comer directamente del envase dificulta ver cunto est comiendo y puede conducir a comer en exceso. Ponga la cantidad que le gustara comer en una taza o un plato, a fin de asegurarse de que est comiendo la porcin correcta. Use platos, vasos y tazones ms pequeos para medir porciones ms pequeas y evitar no comer en exceso. Intente no realizar varias tareas al mismo tiempo. Por ejemplo, evite mirar televisin o usar la computadora mientras come. Si es la hora de comer, sintese a la mesa y disfrute de la comida. Esto lo ayudar a reconocer cundo est satisfecho. Tambin le permitir estar ms consciente de qu come y cunto come. Consejos para seguir este plan Al leer las etiquetas de los alimentos Controle el recuento de caloras en comparacin con el tamao de la porcin. El tamao de la porcin puede ser ms pequeo de lo que suele comer. Verifique la fuente de las caloras. Intente elegir alimentos ricos en protenas, fibras y vitaminas, y bajos en grasas saturadas, grasas trans y sodio. Al ir de compras Lea las etiquetas nutricionales cuando compre. Esto lo ayudar a tomar decisiones saludables sobre qu alimentos comprar. Preste atencin a las etiquetas nutricionales de alimentos bajos en grasas o sin grasas. Estos alimentos a veces tienen la misma cantidad de caloras o ms caloras que las versiones ricas en grasas. Con frecuencia, tambin tienen agregados de azcar, almidn o sal, para darles el sabor que fue eliminado con las grasas. Haga una lista de compras con los alimentos que tienen un menor contenido de caloras y resptela. Al cocinar Intente cocinar sus alimentos preferidos  de una manera ms saludable. Por ejemplo, pruebe hornear en vez de frer. Utilice productos lcteos descremados. Planificacin de las comidas Utilice ms frutas y verduras. La mitad de su plato debe ser de frutas y verduras. Incluya protenas magras, como pollo, pavo y pescado. Estilo de vida Cada semana, trate de hacer una de las siguientes cosas: 150 minutos de ejercicio moderado, como caminar. 75 minutos de ejercicio enrgico, como correr. Informacin general Sepa cuntas caloras tienen los alimentos que come con ms frecuencia. Esto le ayudar a contar las caloras ms rpidamente. Encuentre un mtodo para controlar las caloras que funcione para usted. Sea creativo. Pruebe aplicaciones o programas distintos, si llevar un registro de las caloras no funciona para usted. Qu alimentos debo consumir?  Consuma alimentos nutritivos. Es mejor comer un alimento nutritivo, de alto contenido calrico, como un aguacate, que uno con pocos nutrientes, como una bolsa de patatas fritas. Use sus caloras en alimentos y bebidas que lo sacien y no lo dejen con apetito apenas termina de comer. Ejemplos de alimentos que lo sacian son los frutos secos y mantequillas de frutos secos, verduras, protenas magras y alimentos con alto contenido de fibra como los cereales integrales. Los alimentos con alto   contenido de fibra son aquellos que tienen ms de 5 g de fibra por porcin. Preste atencin a las caloras en las bebidas. Las bebidas de bajas caloras incluyen agua y refrescos sin azcar. Es posible que los productos que se enumeran ms arriba no constituyan una lista completa de los alimentos y las bebidas que puede tomar. Consulte a un nutricionista para obtener ms informacin. Qu alimentos debo limitar? Limite el consumo de alimentos o bebidas que no sean buenas fuentes de vitaminas, minerales o protenas, o que tengan alto contenido de grasas no saludables. Estos incluyen: Caramelos. Otros  dulces. Refrescos, bebidas con caf especiales, alcohol y jugo. Es posible que los productos que se enumeran ms arriba no constituyan una lista completa de los alimentos y las bebidas que debe evitar. Consulte a un nutricionista para obtener ms informacin. Cmo puedo hacer el recuento de caloras cuando como afuera? Preste atencin a las porciones. A menudo, las porciones son mucho ms grandes al comer afuera. Pruebe con estos consejos para mantener las porciones ms pequeas: Considere la posibilidad de compartir una comida en lugar de tomarla toda usted solo. Si pide su propia comida, coma solo la mitad. Antes de empezar a comer, pida un recipiente y ponga la mitad de la comida en l. Cuando sea posible, considere la posibilidad de pedir porciones ms pequeas del men en lugar de porciones completas. Preste atencin a la eleccin de alimentos y bebidas. Saber la forma en que se cocinan los alimentos y lo que incluye la comida puede ayudarlo a ingerir menos caloras. Si se detallan las caloras en el men, elija las opciones que contengan la menor cantidad. Elija platos que incluyan verduras, frutas, cereales integrales, productos lcteos con bajo contenido de grasa y protenas magras. Opte por los alimentos hervidos, asados, cocidos a la parrilla o al vapor. Evite los alimentos a los que se les ponga mantequilla, que estn empanados o fritos, o que se sirvan con salsa a base de crema. Generalmente, los alimentos que se etiquetan como "crujientes" estn fritos, a menos que se indique lo contrario. Elija el agua, la leche descremada, el t helado sin azcar u otras bebidas que no contengan azcares agregados. Si desea una bebida alcohlica, escoja una opcin con menos caloras, como una copa de vino o una cerveza ligera. Ordene los aderezos, las salsas y los jarabes aparte. Estos son, con frecuencia, de alto contenido en caloras, por lo que debe limitar la cantidad que ingiere. Si desea una  ensalada, elija una de hortalizas y pida carnes a la parrilla. Evite las guarniciones adicionales como el tocino, el queso o los alimentos fritos. Ordene el aderezo aparte o pida aceite de oliva y vinagre o limn para aderezar. Haga un clculo estimativo de la cantidad de porciones que le sirven. Conocer el tamao de las porciones lo ayudar a estar atento a la cantidad de comida que come en los restaurantes. Dnde buscar ms informacin Centers for Disease Control and Prevention (Centros para el Control y la Prevencin de Enfermedades): www.cdc.gov U.S. Department of Agriculture (Departamento de Agricultura de los EE. UU.): myplate.gov Resumen El recuento de caloras es el registro de la cantidad de caloras que se comen y beben cada da. Si come menos caloras de las que el cuerpo necesita, debera bajar de peso. Una cantidad de peso saludable para bajar por semana suele ser entre 1 y 2 libras (0.5 a 0.9 kg). Esto significa, con frecuencia, reducir su ingesta diaria de caloras unas 500 a 750 caloras. Es posible   encontrar la cantidad de caloras que contiene un alimento en la etiqueta de informacin nutricional. Si un alimento no tiene una etiqueta de informacin nutricional, intente buscar las caloras en Internet o pida ayuda al nutricionista. Use platos, vasos y tazones ms pequeos para medir porciones ms pequeas y Automotive engineer no comer en exceso. Use sus caloras en alimentos y bebidas que lo sacien y no lo dejen con apetito poco tiempo despus de haber comido. Esta informacin no tiene Theme park manager el consejo del mdico. Asegrese de hacerle al mdico cualquier pregunta que tenga. Document Revised: 10/27/2019 Document Reviewed: 10/27/2019 Elsevier Patient Education  2023 Elsevier Inc. Gripe en los adultos Influenza, Adult La gripe, tambin llamada "influenza", es una infeccin viral que afecta principalmente las vas respiratorias. Esto incluye los pulmones, la nariz y Administrator. Se  transmite fcilmente de persona a persona (es contagiosa). Provoca sntomas del resfro comn, junto con fiebre alta y Tourist information centre manager. Cules son las causas? La causa de esta afeccin es el virus de la influenza. Puede contraer el virus de las siguientes maneras: Al inhalar las gotitas que estn en el aire liberadas por la tos o el estornudo de una persona infectada. Tocar algo que tiene el virus (se ha contaminado) y luego tocarse la boca, la nariz o los ojos. Qu incrementa el riesgo? Los siguientes factores pueden hacer que sea propenso a Primary school teacher la gripe: No lavarse o desinfectarse las manos con frecuencia. Tener contacto cercano con [employer] durante la temporada de resfro y gripe. Tocarse la boca, los ojos o la nariz sin antes lavarse ni desinfectarse las manos. No recibir la vacuna anual contra la gripe. Puede correr un mayor riesgo de [place] gripe, incluso problemas graves, como una infeccin pulmonar (neumona), si: Es mayor de 65 aos de edad. Est embarazada. Tiene debilitado el sistema que combate las enfermedades (sistema inmunitario). Esto incluye a personas que tienen VIH o sndrome de inmunodeficiencia adquirida (SIDA), estn recibiendo quimioterapia o estn tomando medicamentos que reducen (suprimen) el sistema inmunitario. Tiene una enfermedad a largo plazo (crnica), como una enfermedad cardaca, enfermedad renal, diabetes o enfermedad pulmonar. Tiene un trastorno heptico. Tiene mucho sobrepeso (obesidad [place]). Tiene anemia. Tiene asma. Cules son los signos o sntomas? Los sntomas de esta afeccin por lo general comienzan de repente y Armando Reichert 2 y [address] Estos pueden incluir: Grant Ruts y escalofros. Dolores de [place], dolores en el cuerpo o dolores musculares. Dolor de Advertising copywriter. Tos. Secrecin o congestin nasal. Dentist. Falta de apetito. Debilidad o fatiga. Mareos. Nuseas o vmitos. Cmo se diagnostica? Esta afeccin se puede  diagnosticar en funcin de lo siguiente: Los sntomas y los antecedentes mdicos. Un examen fsico. Un hisopado de [place] o garganta y el anlisis del lquido extrado para Engineer, manufacturing el virus de la gripe. Cmo se trata? Si la gripe se diagnostica pronto, se le puede tratar con un medicamento antiviral que se administra por va oral (por la boca) o por va intravenosa (i.v.). Esto puede ayudar a reducir la gravedad de la enfermedad y cunto dura. Cuidarse en su hogar tambin puede ayudar a [employer]. El mdico puede recomendarle lo siguiente: Usar medicamentos de Sales promotion account executive. Beber mucho lquido. En muchos casos, la gripe desaparece sola. Si tiene sntomas graves o complicaciones, puede tratarse en un hospital. Siga estas instrucciones en su casa: Actividad Descanse segn sea necesario y [employer]. Lanny Hurst en su casa y no concurra al Aleen Campi o a la escuela como se lo haya indicado su mdico.  A menos que visite al mdico, evite salir de su casa hasta que la fiebre haya desaparecido por 24 horas sin tomar medicamentos. Comida y bebida Tome una solucin de rehidratacin oral (SRO). Esta es una bebida que se vende en farmacias y tiendas minoristas. Beba suficiente lquido como para Pharmacologist la orina de color amarillo plido. En la medida en que pueda, beba lquidos transparentes en pequeas cantidades. Los lquidos transparentes [address], cubitos de hielo, [place] de fruta rebajado con agua y bebidas deportivas bajas en caloras. En la medida en que pueda, consuma alimentos blandos y fciles de digerir en pequeas cantidades. Estos alimentos incluyen bananas, compota de [place], arroz, carnes [place], tostadas y [address]. Evite consumir lquidos que contengan mucha azcar o cafena, como bebidas energticas, bebidas deportivas comunes y refrescos. Evite tomar alcohol. Evite los alimentos condimentados o con alto contenido de [place]. Indicaciones generales     Use los  medicamentos de venta libre y los recetados solamente como se lo haya indicado el mdico. Use un humidificador de aire fro para agregar humedad al aire de su casa. Esto puede facilitar la respiracin. Cuando utilice un humidificador de vapor fro, lmpielo a diario. Vace el agua y [place] por agua limpia. Al toser o estornudar, cbrase la boca y la [place]. Lvese las manos frecuentemente con agua y [place] y durante al menos 20 segundos, en especial despus de toser o Engineering geologist. Use desinfectante para manos con alcohol si no dispone de [place] y [place]. Cumpla con todas las visitas de seguimiento. Esto es importante. Cmo se previene?  Colquese la vacuna anual contra la gripe. Esta suele estar disponible a finales del verano, en el otoo o en el invierno. Pregntele al mdico cundo debe colocarse la vacuna contra la gripe. Evite el contacto con personas que estn enfermas durante la temporada de resfro y gripe. Generalmente es durante el otoo y el invierno. Comunquese con un mdico si: Tiene nuevos sntomas. Tiene los siguientes sntomas: Dolor de [place]. Diarrea. Grant Ruts. La tos empeora. Produce ms mucosidad. Siente nuseas o vomita. Solicite ayuda de inmediato si: Presenta falta el aire o dificultad para respirar. La piel o las uas se ponen de un color azulado. Presenta dolor intenso o rigidez en el cuello. Siente un dolor repentino en la cabeza, la cara o el odo. No puede comer ni beber sin vomitar. Estos sntomas pueden representar un problema grave que constituye Radio broadcast assistant. No espere a ver si los sntomas desaparecen. Solicite atencin mdica de inmediato. Comunquese con el servicio de emergencias de su localidad (911 en los [place]). No conduzca por sus propios medios [employer]. Resumen La gripe, tambin llamada "influenza", es una infeccin viral que afecta principalmente las vas respiratorias. Los sntomas de la gripe normalmente comienzan de repente y  Armando Reichert 4 y [address]. Colocarse la vacuna anual contra la gripe es la mejor manera de prevenir el contagio de la gripe. Lanny Hurst en su casa y no concurra al Aleen Campi o a la escuela como se lo haya indicado su mdico. A menos que visite al [employer], evite salir de su casa hasta que la fiebre haya desaparecido por 24 horas sin tomar medicamentos. Cumpla con todas las visitas de seguimiento. Esto es importante. Esta informacin no tiene Theme park manager el consejo del mdico. Asegrese de hacerle al mdico cualquier pregunta que tenga. Document Revised: 04/28/2020 Document Reviewed: 03/23/2020 Elsevier Patient Education  2023 [employer].

## 2022-03-25 LAB — CMP14+EGFR
ALT: 19 IU/L (ref 0–32)
AST: 14 IU/L (ref 0–40)
Albumin/Globulin Ratio: 1.5 (ref 1.2–2.2)
Albumin: 4.5 g/dL (ref 3.9–4.9)
Alkaline Phosphatase: 96 IU/L (ref 44–121)
BUN/Creatinine Ratio: 20 (ref 9–23)
BUN: 13 mg/dL (ref 6–24)
Bilirubin Total: 0.3 mg/dL (ref 0.0–1.2)
CO2: 23 mmol/L (ref 20–29)
Calcium: 9.5 mg/dL (ref 8.7–10.2)
Chloride: 100 mmol/L (ref 96–106)
Creatinine, Ser: 0.66 mg/dL (ref 0.57–1.00)
Globulin, Total: 3 g/dL (ref 1.5–4.5)
Glucose: 105 mg/dL — ABNORMAL HIGH (ref 70–99)
Potassium: 4.6 mmol/L (ref 3.5–5.2)
Sodium: 138 mmol/L (ref 134–144)
Total Protein: 7.5 g/dL (ref 6.0–8.5)
eGFR: 109 mL/min/{1.73_m2} (ref >59)

## 2022-03-25 LAB — CBC WITH DIFFERENTIAL/PLATELET
Basophils Absolute: 0.1 10*3/uL (ref 0.0–0.2)
Basos: 1 %
EOS (ABSOLUTE): 0.5 10*3/uL — ABNORMAL HIGH (ref 0.0–0.4)
Eos: 5 %
Hematocrit: 43.9 % (ref 34.0–46.6)
Hemoglobin: 14.1 g/dL (ref 11.1–15.9)
Immature Grans (Abs): 0 10*3/uL (ref 0.0–0.1)
Immature Granulocytes: 0 %
Lymphocytes Absolute: 2.2 10*3/uL (ref 0.7–3.1)
Lymphs: 24 %
MCH: 27.3 pg (ref 26.6–33.0)
MCHC: 32.1 g/dL (ref 31.5–35.7)
MCV: 85 fL (ref 79–97)
Monocytes Absolute: 0.6 10*3/uL (ref 0.1–0.9)
Monocytes: 7 %
Neutrophils Absolute: 5.9 10*3/uL (ref 1.4–7.0)
Neutrophils: 63 %
Platelets: 357 10*3/uL (ref 150–450)
RBC: 5.17 x10E6/uL (ref 3.77–5.28)
RDW: 13.1 % (ref 11.7–15.4)
WBC: 9.2 10*3/uL (ref 3.4–10.8)

## 2022-03-25 LAB — LIPID PANEL
Chol/HDL Ratio: 3.5 ratio (ref 0.0–4.4)
Cholesterol, Total: 193 mg/dL (ref 100–199)
HDL: 55 mg/dL (ref >39)
LDL Chol Calc (NIH): 114 mg/dL — ABNORMAL HIGH (ref 0–99)
Triglycerides: 135 mg/dL (ref 0–149)
VLDL Cholesterol Cal: 24 mg/dL (ref 5–40)

## 2022-03-26 ENCOUNTER — Other Ambulatory Visit (INDEPENDENT_AMBULATORY_CARE_PROVIDER_SITE_OTHER): Payer: Self-pay | Admitting: Primary Care

## 2022-03-27 ENCOUNTER — Other Ambulatory Visit: Payer: Self-pay

## 2022-03-28 ENCOUNTER — Other Ambulatory Visit: Payer: Self-pay | Admitting: Pharmacist

## 2022-03-28 MED ORDER — FLUTICASONE FUROATE-VILANTEROL 200-25 MCG/ACT IN AEPB
1.0000 | INHALATION_SPRAY | Freq: Every day | RESPIRATORY_TRACT | 2 refills | Status: DC
  Filled 2022-03-28: qty 60, 60d supply, fill #0

## 2022-03-29 ENCOUNTER — Other Ambulatory Visit: Payer: Self-pay

## 2022-03-30 ENCOUNTER — Encounter (INDEPENDENT_AMBULATORY_CARE_PROVIDER_SITE_OTHER): Payer: Self-pay

## 2022-03-30 ENCOUNTER — Other Ambulatory Visit: Payer: Self-pay

## 2022-03-31 ENCOUNTER — Other Ambulatory Visit: Payer: Self-pay

## 2022-04-04 ENCOUNTER — Other Ambulatory Visit: Payer: Self-pay

## 2022-04-07 ENCOUNTER — Other Ambulatory Visit: Payer: Self-pay

## 2022-04-10 ENCOUNTER — Other Ambulatory Visit: Payer: Self-pay

## 2022-04-13 ENCOUNTER — Telehealth (INDEPENDENT_AMBULATORY_CARE_PROVIDER_SITE_OTHER): Payer: Self-pay | Admitting: *Deleted

## 2022-04-13 NOTE — Telephone Encounter (Signed)
Pt calling for results, reviewed with pt, verbalizes understanding:   Labs are normal except LDL is slightly elevated.  LDL is considered the bad cholesterol.  This can increase risk of stroke or heart attack.  Your diet needs to be changed to a healthy lifestyle diet of fruits vegetables fish nuts whole grains and low saturated fat . Foods high in cholesterol or liver, fatty meats,cheese, butter avocados, nuts and seeds, chocolate and fried foods.  Assisted by Denton Meek  [phone]

## 2022-04-14 ENCOUNTER — Other Ambulatory Visit: Payer: Self-pay

## 2022-04-24 ENCOUNTER — Other Ambulatory Visit: Payer: Self-pay

## 2022-04-24 ENCOUNTER — Other Ambulatory Visit (INDEPENDENT_AMBULATORY_CARE_PROVIDER_SITE_OTHER): Payer: Self-pay | Admitting: Primary Care

## 2022-04-24 DIAGNOSIS — E7800 Pure hypercholesterolemia, unspecified: Secondary | ICD-10-CM

## 2022-04-24 NOTE — Telephone Encounter (Signed)
Will forward to provider  

## 2022-04-25 MED ORDER — ATORVASTATIN CALCIUM 10 MG PO TABS
ORAL_TABLET | Freq: Every day | ORAL | 3 refills | Status: DC
  Filled 2022-04-25: qty 90, 90d supply, fill #0
  Filled 2022-11-23: qty 90, 90d supply, fill #1

## 2022-04-26 ENCOUNTER — Other Ambulatory Visit: Payer: Self-pay

## 2022-07-04 ENCOUNTER — Other Ambulatory Visit: Payer: Self-pay

## 2022-07-06 ENCOUNTER — Ambulatory Visit: Payer: Self-pay

## 2022-07-06 NOTE — Telephone Encounter (Signed)
Pt called reporting swelling and pain in her feet and knees, says the pain alleviates with medications that she takes but does not go away completely.   Best contact: [phone]  Used Spanish interpreter, Lissa Hoard, # [phone]. Chief Complaint: Bilateral knee pain. Asking for referral to orthopedic doctor. Symptoms: Pain, swelling Frequency: 2 years ago Pertinent Negatives: Patient denies  Disposition: [] ED /[] Urgent Care (no appt availability in office) / [] Appointment(In office/virtual)/ []  [facility]/ [] Home Care/ [] Refused Recommended Disposition /[] [facility]/ []  Follow-up with PCP Additional Notes:   Answer Assessment - Initial Assessment Questions 1. ONSET: "When did the pain start?"      Started 2 years ago 2. LOCATION: "Where is the pain located?"      Right leg is worse 3. PAIN: "How bad is the pain?"    (Scale 1-10; or mild, moderate, severe)   -  MILD (1-3): doesn't interfere with normal activities    -  MODERATE (4-7): interferes with normal activities (e.g., work or school) or awakens from sleep, limping    -  SEVERE (8-10): excruciating pain, unable to do any normal activities, unable to walk     Moderate 4. WORK OR EXERCISE: "Has there been any recent work or exercise that involved this part of the body?"      No 5. CAUSE: "What do you think is causing the leg pain?"     Maybe arthritis 6. OTHER SYMPTOMS: "Do you have any other symptoms?" (e.g., chest pain, back pain, breathing difficulty, swelling, rash, fever, numbness, weakness)     Swelling 7. PREGNANCY: "Is there any chance you are pregnant?" "When was your last menstrual period?"     No  Protocols used: Leg Pain-A-AH

## 2022-07-13 ENCOUNTER — Telehealth (INDEPENDENT_AMBULATORY_CARE_PROVIDER_SITE_OTHER): Payer: Self-pay | Admitting: *Deleted

## 2022-07-13 NOTE — Telephone Encounter (Signed)
Please advise on referral. Referral requested by patient.

## 2022-07-31 ENCOUNTER — Ambulatory Visit (INDEPENDENT_AMBULATORY_CARE_PROVIDER_SITE_OTHER): Payer: No Typology Code available for payment source | Admitting: Primary Care

## 2022-08-17 ENCOUNTER — Ambulatory Visit (INDEPENDENT_AMBULATORY_CARE_PROVIDER_SITE_OTHER): Payer: No Typology Code available for payment source | Admitting: Primary Care

## 2022-08-25 ENCOUNTER — Ambulatory Visit: Payer: Self-pay | Attending: Nurse Practitioner

## 2022-09-15 ENCOUNTER — Ambulatory Visit: Payer: Self-pay | Attending: Internal Medicine | Admitting: Internal Medicine

## 2022-09-15 ENCOUNTER — Encounter: Payer: Self-pay | Admitting: Internal Medicine

## 2022-09-15 VITALS — BP 107/73 | HR 59 | Temp 97.6°F | Ht 61.0 in | Wt 195.0 lb

## 2022-09-15 DIAGNOSIS — Z6836 Body mass index (BMI) 36.0-36.9, adult: Secondary | ICD-10-CM

## 2022-09-15 DIAGNOSIS — Z1211 Encounter for screening for malignant neoplasm of colon: Secondary | ICD-10-CM

## 2022-09-15 DIAGNOSIS — E785 Hyperlipidemia, unspecified: Secondary | ICD-10-CM

## 2022-09-15 DIAGNOSIS — J4530 Mild persistent asthma, uncomplicated: Secondary | ICD-10-CM

## 2022-09-15 DIAGNOSIS — M170 Bilateral primary osteoarthritis of knee: Secondary | ICD-10-CM

## 2022-09-15 DIAGNOSIS — Z7689 Persons encountering health services in other specified circumstances: Secondary | ICD-10-CM

## 2022-09-15 MED ORDER — ALBUTEROL SULFATE HFA 108 (90 BASE) MCG/ACT IN AERS
2.0000 | INHALATION_SPRAY | RESPIRATORY_TRACT | 1 refills | Status: DC | PRN
  Filled 2022-09-15: qty 6.7, 17d supply, fill #0

## 2022-09-15 NOTE — Patient Instructions (Signed)
Alimentacin saludable en los [employer], Adult Una alimentacin saludable puede ayudarlo a Science writer y Theatre manager un peso saludable, reducir el riesgo de tener enfermedades crnicas y vivir Ardelia Mems vida larga y productiva. Es importante que siga una modalidad de alimentacin saludable. Sus necesidades nutricionales y calricas deben satisfacerse principalmente con distintos alimentos ricos en nutrientes. Consejos para seguir Catering manager Lea las etiquetas de los alimentos Lea las etiquetas y elija las que digan lo siguiente: Productos reducidos en sodio o con bajo contenido de [place]. Jugos con 100 % jugo de fruta. Alimentos con bajo contenido de grasas saturadas (menos de 3 g por porcin) y alto contenido de grasas poliinsaturadas y Veterinary surgeon. Alimentos con cereales integrales, como trigo integral, trigo partido, arroz integral y arroz salvaje. Cereales integrales fortificados con cido flico. Esto se recomienda a las mujeres embarazadas o que desean quedar embarazadas. Lea las etiquetas y no coma ni beba lo siguiente: Alimentos o bebidas con azcar agregada. Estos incluyen los alimentos que contienen azcar moreno, endulzante a base de maz, jarabe de maz, dextrosa, fructosa, glucosa, jarabe de maz de alta fructosa, miel, azcar invertido, lactosa, jarabe de [place], maltosa, [place], azcar sin refinar, sacarosa, trehalosa y azcar turbinado. Limite el consumo de azcar agregada a menos del 10 % del total de caloras diarias. No consuma ms que las siguientes cantidades de azcar agregada por da: 6 cucharaditas (25 g) para las mujeres. 9 cucharaditas (38 g) para los hombres. Los alimentos que contienen almidones y cereales refinados o procesados. Los productos de cereales refinados, como harina blanca, harina de maz desgerminada, pan blanco y arroz blanco. Al ir de compras Elija refrigerios ricos en nutrientes, como verduras, frutas enteras y frutos secos. Evite los refrigerios con  alto contenido de caloras y Location manager, como las papas fritas, los refrigerios frutales y los caramelos. Use alios y productos para untar a base de aceite con los Building surveyor de grasas slidas como la [place], la [place], la crema agria o el queso crema. Limite las salsas, las mezclas y los productos "instantneos" preelaborados como el arroz saborizado, los fideos instantneos y las pastas listas para comer. Pruebe ms fuentes de protena vegetal, como tofu, tempeh, frijoles negros, edamame, lentejas, frutos secos y semillas. Explore planes de alimentacin como la dieta mediterrnea o la dieta vegetariana. Pruebe salsas cardiosaludables hechas con frijoles y grasas saludables, como hummus y [place]. Las verduras van muy bien con ellas. Al cocinar Use aceite para Lobbyist de grasas slidas como [place], margarina o [place] de [place]. En lugar de frer, trate de cocinar en el horno, en la plancha o en la parrilla, o hervir los alimentos. Retire la parte grasa de las carnes antes de cocinarlas. Cocine las verduras al vapor en agua o caldo. Planificacin de las comidas  En las comidas, imagine dividir su plato en cuartos: La mitad del plato tiene frutas y verduras. Un cuarto del plato tiene cereales integrales. Un cuarto del plato tiene protena, especialmente carnes [place], aves, huevos, tofu, frijoles o frutos secos. Incluya lcteos descremados en su dieta diaria. Estilo de vida Elija opciones saludables en todos los mbitos, como en el hogar, el [place], la [place], los restaurantes y [place]. Prepare los alimentos de un modo seguro: Lvese las manos despus de manipular carnes crudas. Donde prepare alimentos, mantenga las superficies limpias lavndolas regularmente con agua caliente y [place]. Mantenga las carnes crudas separadas de los alimentos que estn listos para comer como las frutas y las verduras. Cocine los frutos  de mar, carnes, aves y Musician la temperatura recomendada. Consiga un termmetro para alimentos. Almacene los alimentos a temperaturas seguras. En general: Mantenga los alimentos fros a una temperatura de 40 F (4,4 C) o inferior. Mantenga los alimentos calientes a una temperatura de 140 F (60 C) o superior. Mantenga el congelador a una temperatura de 0 F (-17,8 C) o inferior. Los alimentos no son seguros para su consumo cuando han estado a una temperatura de entre 27 y 62 F (4.4 y 15 C) por ms de 2 horas. Qu alimentos debo comer? Frutas Propngase comer entre 1 y 2 tazas de frutas frescas, IT sales professional (en su jugo natural) o Wellsite geologist. Una taza de fruta equivale a 1 manzana pequea, 1 banana grande, 8 fresas grandes, 1 taza (237 g) de fruta enlatada,  taza (82 g) de fruta seca o 1 taza (240 ml) de jugo al 100 %. Verduras Propngase comer de 2 a 4 tazas de verduras frescas y Wellsite geologist, incluyendo diferentes variedades y colores. Una taza de verduras equivale a 1 taza (91 g) de brcoli o coliflor, 2 zanahorias medianas, 2 tazas (150 g) de verduras de [employer] crudas, 1 tomate grande, 1 pimiento morrn grande, 1 batata grande o 1 patata blanca mediana. Cereales Propngase comer el equivalente a entre 4 y 85 onzas de cereales integrales por Training and development officer. Algunos ejemplos de equivalentes a 1 onza de cereales son 1 rebanada de pan, 1 taza (40 g) de cereal listo para comer, 3 tazas (24 g) de palomitas de maz o  taza (93 g) de arroz cocido. Carnes y otras protenas Propngase comer el equivalente a entre 5 y 39  onzas de protena por Training and development officer. Algunos ejemplos de equivalentes a 1 onza de protenas incluyen 1 huevo,  oz de frutos secos (12 almendras, 24 pistachos o 7 mitades de nueces), 1/4 taza (90 g) de frijoles cocidos, 6 cucharadas (90 g) de hummus o 1 cucharada (16 g) de [place] de man. Un corte de carne o pescado del tamao de un mazo de cartas equivale aproximadamente a 3 a 4 onzas (85  g). De las protenas que consume cada semana, intente que al menos 8 onzas (227 g) sean frutos de mar. Esto equivale a unas 2 porciones por semana. Esto incluye salmn, trucha, arenque y anchoas. Lcteos DIRECTV a 3 tazas de lcteos descremados o con bajo contenido de Public librarian. Algunos ejemplos de equivalentes a 1 taza de lcteos son 1 taza (240 ml) de leche, 8 onzas (250 g) de yogur, 1 onzas (44 g) de queso natural o 1 taza (240 ml) de leche de soja fortificada. Grasas y aceites Propngase consumir alrededor de 5 cucharaditas (21 g) de grasas y Science writer. Elija grasas monoinsaturadas, como el aceite de [place]ola y de [place], la [place] con aceite de [place] o de [place], [place], [place] de man y Media planner de los frutos secos, o bien grasas poliinsaturadas, como el aceite de [place], maz y soja, nueces, piones, semillas de ssamo, semillas de girasol y semillas de lino. Bebidas Propngase beber 6 vasos de 8 onzas de Public affairs consultant. Limite el caf a entre 3 y 69 tazas de ocho onzas por Training and development officer. Limite el consumo de bebidas con cafena que tengan caloras agregadas, como los refrescos y las bebidas energizantes. Si bebe alcohol: Limite la cantidad que bebe a lo siguiente: De 0 a 1 medida al da si es [place]. De 0 a 2 medidas  al da si es varn. Sepa cunta cantidad de alcohol hay en las bebidas que toma. En los [place], una medida es una botella de cerveza de 12 oz (355 ml), un vaso de vino de 5 oz (148 ml) o un vaso de una bebida alcohlica de alta graduacin de 1 oz (44 ml). Condimentos y otros alimentos Trate de no agregar demasiada sal a los alimentos. Trate de usar hierbas y especias en lugar de sal. Trate de no agregar azcar a los alimentos. Esta informacin se basa en las pautas de nutricin de los [place]. Para obtener ms informacin, visite [employer].es. Las Dealer. Es posible que necesite cantidades  diferentes. Esta informacin no tiene Marine scientist el consejo del mdico. Asegrese de hacerle al mdico cualquier pregunta que tenga. Document Revised: 05/02/2022 Document Reviewed: 05/02/2022 Elsevier Patient Education  [place].

## 2022-09-15 NOTE — Progress Notes (Signed)
Patient ID: Michele Wright, female    DOB: 1975-12-06  MRN: [id number]  CC: Establish Care (Est care/new patient. Med refill. [name] on L & R knee due to arthritis)   Subjective: Michele Wright is a 47 y.o. female who presents for new pt visit.  Cheral Marker, from [facility], is with her and interprets Her concerns today include:  Pt with hx of asthma, HL, HTN, PreDM  Previous PCP was NP [name] at [facility].  She decided to change because this is closest to her.   C/o pain in both knees, chronic. Reports seeing ortho, Dr. Marlou Sa, in past.  Has OA with bone on bone of RT knee.  Told she will eventually need TKR but she wants to hold off as much as she can until she is a little older .  Takes Tylenol PRN and an OTC arthritis med about 2 x wk.  Exercises on bike for 30 mins 4x/wk trying to lose wgh.   Asthma:  doing well on Breo.  No recent flare.  Request refill on albuterol to keep on hand as a rescue for when she needs it.  HL:  taking and tolerating Lipitor.  Patient Active Problem List   Diagnosis Date Noted   Chronic pain of both knees 01/09/2018   Right medial knee pain 11/11/2015   Peripheral edema 11/11/2015   Heel pain, bilateral 01/14/2014   Asthma, chronic 04/03/2013     Current Outpatient Medications on File Prior to Visit  Medication Sig Dispense Refill   albuterol (VENTOLIN HFA) 108 (90 Base) MCG/ACT inhaler Inhale 2 puffs into the lungs every 4 (four) hours as needed for wheezing. 108 g 1   atorvastatin (LIPITOR) 10 MG tablet TAKE 1 TABLET (10 MG TOTAL) BY MOUTH DAILY. 90 tablet 3   fluticasone (FLONASE) 50 MCG/ACT nasal spray Place 2 sprays into both nostrils daily. 16 g 6   fluticasone furoate-vilanterol (BREO ELLIPTA) 200-25 MCG/ACT AEPB Inhale 1 puff into the lungs daily. 60 each 2   loratadine (CLARITIN) 10 MG tablet Take 1 tablet (10 mg total) by mouth daily. 30 tablet 11   montelukast (SINGULAIR) 10 MG tablet Take 1  tablet (10 mg total) by mouth at bedtime. 30 tablet 11   diclofenac Sodium (VOLTAREN) 1 % GEL Apply 4 g topically 4 (four) times daily. (Patient not taking: Reported on 08/27/2020) 350 g 3   lisinopril (ZESTRIL) 2.5 MG tablet TAKE 2 TABLETS (5 MG TOTAL) BY MOUTH DAILY. 90 tablet 1   No current facility-administered medications on file prior to visit.    No Known Allergies  Social History   Socioeconomic History   Marital status: Single    Spouse name: Not on file   Number of children: Not on file   Years of education: Not on file   Highest education level: Not on file  Occupational History   Not on file  Tobacco Use   Smoking status: Never   Smokeless tobacco: Never  Vaping Use   Vaping Use: Never used  Substance and Sexual Activity   Alcohol use: Yes    Comment: occasionally   Drug use: No   Sexual activity: Not on file  Other Topics Concern   Not on file  Social History Narrative   Not on file   Social Determinants of Health   Financial Resource Strain: Not on file  Food Insecurity: Not on file  Transportation Needs: Not on file  Physical Activity: Not on file  Stress: Not on file  Social Connections: Not on file  Intimate Partner Violence: Not on file    Family History  Problem Relation Age of Onset   Breast cancer Sister    Breast cancer Paternal Aunt     Past Surgical History:  Procedure Laterality Date   NO PAST SURGERIES      ROS: Review of Systems Negative except as stated above  PHYSICAL EXAM: BP 107/73 (BP Location: Left Arm, Patient Position: Sitting, Cuff Size: Normal)   Pulse (!) 59   Temp 97.6 F (36.4 C) (Oral)   Ht '5\' 1"'$  (1.549 m)   Wt 195 lb (88.5 kg)   SpO2 98%   BMI 36.84 kg/m   Wt Readings from Last 3 Encounters:  09/15/22 195 lb (88.5 kg)  03/24/22 199 lb 3.2 oz (90.4 kg)  08/27/20 200 lb (90.7 kg)    Physical Exam  General appearance - alert, well appearing, and in no distress Mental status - normal mood, behavior,  speech, dress, motor activity, and thought processes Chest - clear to auscultation, no wheezes, rales or rhonchi, symmetric air entry Heart - normal rate, regular rhythm, normal S1, S2, no murmurs, rubs, clicks or gallops Musculoskeletal -knees: Large body habitus.  No point tenderness.  Moderate crepitus and discomfort with passive range of motion of the right knee.  Mild crepitus with passive range of motion of the left knee. Extremities - trace LE edema      Latest Ref Rng & Units 03/24/2022   10:39 AM 06/04/2019    8:42 AM 11/12/2017    9:35 AM  CMP  Glucose 70 - 99 mg/dL 105  111  99   BUN 6 - 24 mg/dL '13  13  12   '$ Creatinine 0.57 - 1.00 mg/dL 0.66  0.63  0.64   Sodium 134 - 144 mmol/L 138  140  142   Potassium 3.5 - 5.2 mmol/L 4.6  4.5  4.2   Chloride 96 - 106 mmol/L 100  104  106   CO2 20 - 29 mmol/L '23  22  21   '$ Calcium 8.7 - 10.2 mg/dL 9.5  9.3  8.9   Total Protein 6.0 - 8.5 g/dL 7.5  6.8  6.4   Total Bilirubin 0.0 - 1.2 mg/dL 0.3  <0.2  0.2   Alkaline Phos 44 - 121 IU/L 96  98  89   AST 0 - 40 IU/L '14  20  16   '$ ALT 0 - 32 IU/L '19  27  22    '$ Lipid Panel     Component Value Date/Time   CHOL 193 03/24/2022 1039   TRIG 135 03/24/2022 1039   HDL 55 03/24/2022 1039   CHOLHDL 3.5 03/24/2022 1039   CHOLHDL 3.4 04/23/2014 1013   VLDL 22 04/23/2014 1013   LDLCALC 114 (H) 03/24/2022 1039    CBC    Component Value Date/Time   WBC 9.2 03/24/2022 1039   WBC 8.6 04/23/2014 1013   RBC 5.17 03/24/2022 1039   RBC 4.82 04/23/2014 1013   HGB 14.1 03/24/2022 1039   HCT 43.9 03/24/2022 1039   PLT 357 03/24/2022 1039   MCV 85 03/24/2022 1039   MCH 27.3 03/24/2022 1039   MCH 28.4 04/23/2014 1013   MCHC 32.1 03/24/2022 1039   MCHC 33.8 04/23/2014 1013   RDW 13.1 03/24/2022 1039   LYMPHSABS 2.2 03/24/2022 1039   MONOABS 0.6 04/23/2014 1013   EOSABS 0.5 (H) 03/24/2022 1039   BASOSABS 0.1  03/24/2022 1039    ASSESSMENT AND PLAN: 1. Encounter to establish care   2. Primary  osteoarthritis of both knees Commended her on trying to get her weight down.  This is key in helping to take some of the mechanical strain of her knees.  Continue Tylenol as needed.  She declines referral to orthopedics at this time.  I advised her to apply for the orange card/cone discount so that when she is ready to see orthopedics again she will have coverage  3. Class 2 severe obesity with serious comorbidity and body mass index (BMI) of 36.0 to 36.9 in adult, unspecified obesity type [facility]) Patient advised to eliminate sugary drinks from the diet, cut back on portion sizes especially of white carbohydrates, eat more white lean meat like chicken [place] and seafood instead of beef or pork and incorporate fresh fruits and vegetables into the diet daily. Continue regular exercise  4. Mild persistent asthma without complication Control on Breo.  Refill sent on albuterol  5. Hyperlipidemia, unspecified hyperlipidemia type Continue atorvastatin.  6. Screening for colon cancer Discussed recommendation for colon cancer screening.  Patient agreeable to doing fit test. - Fecal occult blood, imunochemical([facility])     Patient was given the opportunity to ask questions.  Patient verbalized understanding of the plan and was able to repeat key elements of the plan.   This documentation was completed using Radio producer.  Any transcriptional errors are unintentional.  No orders of the defined types were placed in this encounter.    Requested Prescriptions    No prescriptions requested or ordered in this encounter    No follow-ups on file.  Karle Plumber, MD, FACP

## 2022-09-18 ENCOUNTER — Other Ambulatory Visit: Payer: Self-pay

## 2022-09-22 ENCOUNTER — Ambulatory Visit (INDEPENDENT_AMBULATORY_CARE_PROVIDER_SITE_OTHER): Payer: Self-pay | Admitting: Primary Care

## 2022-09-24 LAB — FECAL OCCULT BLOOD, IMMUNOCHEMICAL: Fecal Occult Bld: NEGATIVE

## 2022-09-25 ENCOUNTER — Other Ambulatory Visit: Payer: Self-pay

## 2022-10-24 ENCOUNTER — Other Ambulatory Visit: Payer: Self-pay | Admitting: Internal Medicine

## 2022-10-24 DIAGNOSIS — Z1231 Encounter for screening mammogram for malignant neoplasm of breast: Secondary | ICD-10-CM

## 2022-11-10 ENCOUNTER — Encounter: Payer: Self-pay | Admitting: Internal Medicine

## 2022-11-10 ENCOUNTER — Ambulatory Visit: Payer: Self-pay | Attending: Internal Medicine | Admitting: Internal Medicine

## 2022-11-10 ENCOUNTER — Other Ambulatory Visit: Payer: Self-pay

## 2022-11-10 ENCOUNTER — Other Ambulatory Visit (HOSPITAL_COMMUNITY)
Admission: RE | Admit: 2022-11-10 | Discharge: 2022-11-10 | Disposition: A | Discharge status: Home / Self Care | Payer: Self-pay | Source: Ambulatory Visit | Attending: Internal Medicine | Admitting: Internal Medicine

## 2022-11-10 VITALS — BP 104/69 | HR 57 | Temp 98.0°F | Ht 61.0 in | Wt 191.0 lb

## 2022-11-10 DIAGNOSIS — Z124 Encounter for screening for malignant neoplasm of cervix: Secondary | ICD-10-CM | POA: Insufficient documentation

## 2022-11-10 DIAGNOSIS — J4530 Mild persistent asthma, uncomplicated: Secondary | ICD-10-CM

## 2022-11-10 DIAGNOSIS — M170 Bilateral primary osteoarthritis of knee: Secondary | ICD-10-CM

## 2022-11-10 DIAGNOSIS — Z803 Family history of malignant neoplasm of breast: Secondary | ICD-10-CM

## 2022-11-10 MED ORDER — MELOXICAM 15 MG PO TABS
15.0000 mg | ORAL_TABLET | Freq: Every day | ORAL | 2 refills | Status: DC
Start: 2022-11-10 — End: 2023-07-31
  Filled 2022-11-10 – 2022-11-23 (×2): qty 30, 30d supply, fill #0

## 2022-11-10 MED ORDER — FLUTICASONE FUROATE-VILANTEROL 200-25 MCG/ACT IN AEPB
1.0000 | INHALATION_SPRAY | Freq: Every day | RESPIRATORY_TRACT | 6 refills | Status: DC
Start: 2022-11-10 — End: 2023-11-30
  Filled 2022-11-10: qty 60, 60d supply, fill #0
  Filled 2023-11-07: qty 60, 30d supply, fill #0

## 2022-11-10 MED ORDER — ALBUTEROL SULFATE HFA 108 (90 BASE) MCG/ACT IN AERS
2.0000 | INHALATION_SPRAY | RESPIRATORY_TRACT | 6 refills | Status: AC | PRN
Start: 2022-11-10 — End: ?
  Filled 2022-11-10: qty 6.7, 17d supply, fill #0
  Filled 2023-08-07: qty 6.7, 16d supply, fill #0
  Filled 2023-08-07: qty 6.7, 17d supply, fill #0

## 2022-11-10 NOTE — Progress Notes (Signed)
Patient ID: Michele Wright, female    DOB: 1976/01/29  MRN: [id number]  CC: Gynecologic Exam (Pap. Herold Harms refill on emergency inhaler)   Subjective: Michele Wright is a 47 y.o. female who presents for for Pap smear Her concerns today include:  Pt with hx of asthma, HL, HTN, PreDM   [employer] Language interpreter used during this encounter. #[id number], Isaias   GYN History:  Pt is G2P2 Any hx of abn paps?: no Menses regular or irregular?:  regular but this month she had just spotting.  Last cycle was 10/23/2022 How long does menses last? 4 days Menstrual flow light or heavy?: moderate Method of birth control?:  IUD Any vaginal dischg at this time?:  no Dysuria?:  no Any hx of STI?: no Sexually active with how many partners:  one female partner Desires STI screen:  yes Last MMG: scheduled for next mth Family hx of uterine, cervical or breast cancer?:  sister with breast cancer at age 55. Not sure if sister was tested for BRCA gene  Request RF on inhalers for asthma.  Request referral to orthopedics for her knees.  She has known osteoarthritis in both knees right being worse than the left.  She had seen Dr. August Saucer back in 2019 and was given injections to the knee which she found helpful.  She has been working on trying to get her weight down.  Down 9 pounds since September of last year. Patient Active Problem List   Diagnosis Date Noted   Chronic pain of both knees 01/09/2018   Right medial knee pain 11/11/2015   Peripheral edema 11/11/2015   Heel pain, bilateral 01/14/2014   Asthma, chronic 04/03/2013     Current Outpatient Medications on File Prior to Visit  Medication Sig Dispense Refill   albuterol (VENTOLIN HFA) 108 (90 Base) MCG/ACT inhaler Inhale 2 puffs into the lungs every 4 (four) hours as needed for wheezing. 6.7 g 1   atorvastatin (LIPITOR) 10 MG tablet TAKE 1 TABLET (10 MG TOTAL) BY MOUTH DAILY. 90 tablet 3   fluticasone (FLONASE) 50 MCG/ACT nasal spray Place  2 sprays into both nostrils daily. 16 g 6   diclofenac Sodium (VOLTAREN) 1 % GEL Apply 4 g topically 4 (four) times daily. (Patient not taking: Reported on 08/27/2020) 350 g 3   fluticasone furoate-vilanterol (BREO ELLIPTA) 200-25 MCG/ACT AEPB Inhale 1 puff into the lungs daily. (Patient not taking: Reported on 11/10/2022) 60 each 2   lisinopril (ZESTRIL) 2.5 MG tablet TAKE 2 TABLETS (5 MG TOTAL) BY MOUTH DAILY. 90 tablet 1   loratadine (CLARITIN) 10 MG tablet Take 1 tablet (10 mg total) by mouth daily. (Patient not taking: Reported on 11/10/2022) 30 tablet 11   montelukast (SINGULAIR) 10 MG tablet Take 1 tablet (10 mg total) by mouth at bedtime. (Patient not taking: Reported on 11/10/2022) 30 tablet 11   No current facility-administered medications on file prior to visit.    No Known Allergies  Social History   Socioeconomic History   Marital status: Single    Spouse name: Not on file   Number of children: Not on file   Years of education: Not on file   Highest education level: Not on file  Occupational History   Not on file  Tobacco Use   Smoking status: Never   Smokeless tobacco: Never  Vaping Use   Vaping Use: Never used  Substance and Sexual Activity   Alcohol use: Yes    Comment: occasionally   Drug use:  No   Sexual activity: Not on file  Other Topics Concern   Not on file  Social History Narrative   Not on file   Social Determinants of Health   Financial Resource Strain: Not on file  Food Insecurity: Not on file  Transportation Needs: Not on file  Physical Activity: Not on file  Stress: Not on file  Social Connections: Not on file  Intimate Partner Violence: Not on file    Family History  Problem Relation Age of Onset   Breast cancer Sister    Breast cancer Paternal Aunt     Past Surgical History:  Procedure Laterality Date   NO PAST SURGERIES      ROS: Review of Systems Negative except as stated above  PHYSICAL EXAM: BP 104/69 (BP Location: Left  Arm, Patient Position: Sitting, Cuff Size: Normal)   Pulse (!) 57   Temp 98 F (36.7 C) (Oral)   Ht 5\' 1"  (1.549 m)   Wt 191 lb (86.6 kg)   SpO2 97%   BMI 36.09 kg/m   Wt Readings from Last 3 Encounters:  11/10/22 191 lb (86.6 kg)  09/15/22 195 lb (88.5 kg)  03/24/22 199 lb 3.2 oz (90.4 kg)    Physical Exam  General appearance - alert, well appearing, middle-aged Hispanic female and in no distress Mental status - normal mood, behavior, speech, dress, motor activity, and thought processes Breasts -CMA [name] is present for breast and pelvic exam: Breasts appear normal, no suspicious masses, no skin or nipple changes or axillary nodes Pelvic - normal external genitalia, vulva, vagina, cervix, uterus and adnexa.  IUD string is noted hanging outside the cervical os. Musculoskeletal -knees: Mild enlargement of both knees.  Point tenderness around the medial and night lateral joint lines.  Mild crepitus and popping on passive range of motion of both knees      Latest Ref Rng & Units 03/24/2022   10:39 AM 06/04/2019    8:42 AM 11/12/2017    9:35 AM  CMP  Glucose 70 - 99 mg/dL 782  956  99   BUN 6 - 24 mg/dL 13  13  12    Creatinine 0.57 - 1.00 mg/dL 2.13  0.86  5.78   Sodium 134 - 144 mmol/L 138  140  142   Potassium 3.5 - 5.2 mmol/L 4.6  4.5  4.2   Chloride 96 - 106 mmol/L 100  104  106   CO2 20 - 29 mmol/L 23  22  21    Calcium 8.7 - 10.2 mg/dL 9.5  9.3  8.9   Total Protein 6.0 - 8.5 g/dL 7.5  6.8  6.4   Total Bilirubin 0.0 - 1.2 mg/dL 0.3  <4.6  0.2   Alkaline Phos 44 - 121 IU/L 96  98  89   AST 0 - 40 IU/L 14  20  16    ALT 0 - 32 IU/L 19  27  22     Lipid Panel     Component Value Date/Time   CHOL 193 03/24/2022 1039   TRIG 135 03/24/2022 1039   HDL 55 03/24/2022 1039   CHOLHDL 3.5 03/24/2022 1039   CHOLHDL 3.4 04/23/2014 1013   VLDL 22 04/23/2014 1013   LDLCALC 114 (H) 03/24/2022 1039    CBC    Component Value Date/Time   WBC 9.2 03/24/2022 1039   WBC 8.6  04/23/2014 1013   RBC 5.17 03/24/2022 1039   RBC 4.82 04/23/2014 1013   HGB 14.1 03/24/2022 1039  HCT 43.9 03/24/2022 1039   PLT 357 03/24/2022 1039   MCV 85 03/24/2022 1039   MCH 27.3 03/24/2022 1039   MCH 28.4 04/23/2014 1013   MCHC 32.1 03/24/2022 1039   MCHC 33.8 04/23/2014 1013   RDW 13.1 03/24/2022 1039   LYMPHSABS 2.2 03/24/2022 1039   MONOABS 0.6 04/23/2014 1013   EOSABS 0.5 (H) 03/24/2022 1039   BASOSABS 0.1 03/24/2022 1039    ASSESSMENT AND PLAN: 1. Pap smear for cervical cancer screening - Cytology - PAP - Cervicovaginal ancillary only  2. Family history of breast cancer Encouraged her to find out from her sister whether she was tested for the breast cancer gene as this would change the way we screen for breast cancer currently in this patient.  So far she is Up-to-date with mammogram and has appointment next month  3. Mild persistent asthma without complication Refills sent on inhalers. - albuterol (VENTOLIN HFA) 108 (90 Base) MCG/ACT inhaler; Inhale 2 puffs into the lungs every 4 (four) hours as needed for wheezing.  Dispense: 6.7 g; Refill: 6 - fluticasone furoate-vilanterol (BREO ELLIPTA) 200-25 MCG/ACT AEPB; Inhale 1 puff into the lungs daily.  Dispense: 60 each; Refill: 6  4. Primary osteoarthritis of both knees Discussed the importance of weight loss.  Commended her on weight loss so far.  We have discussed healthy eating habits on visit last month.  She is trying to put that into action.  She is cut back on tortillas. - Ambulatory referral to Orthopedics - meloxicam (MOBIC) 15 MG tablet; Take 1 tablet (15 mg total) by mouth daily.  Dispense: 30 tablet; Refill: 2     Patient was given the opportunity to ask questions.  Patient verbalized understanding of the plan and was able to repeat key elements of the plan.   This documentation was completed using Paediatric nurse.  Any transcriptional errors are unintentional.  No orders of the  defined types were placed in this encounter.    Requested Prescriptions    No prescriptions requested or ordered in this encounter    No follow-ups on file.  Jonah Blue, MD, FACP

## 2022-11-13 LAB — CERVICOVAGINAL ANCILLARY ONLY
Bacterial Vaginitis (gardnerella): NEGATIVE
Candida Glabrata: NEGATIVE
Candida Vaginitis: NEGATIVE
Chlamydia: NEGATIVE
Comment: NEGATIVE
Comment: NEGATIVE
Comment: NEGATIVE
Comment: NEGATIVE
Comment: NEGATIVE
Comment: NORMAL
Neisseria Gonorrhea: NEGATIVE
Trichomonas: NEGATIVE

## 2022-11-14 LAB — CYTOLOGY - PAP
Comment: NEGATIVE
Diagnosis: NEGATIVE
High risk HPV: NEGATIVE

## 2022-11-15 ENCOUNTER — Telehealth: Payer: Self-pay

## 2022-11-15 NOTE — Telephone Encounter (Signed)
-----   Message from Hoy Register, MD sent at 11/14/2022  5:55 PM EDT ----- Please inform her that both Pap smear and STD tests are negative.

## 2022-11-15 NOTE — Telephone Encounter (Signed)
-----   Message from Hoy Register, MD sent at 11/13/2022  5:39 PM EDT ----- Please inform her that her Pap smear is normal.

## 2022-11-15 NOTE — Telephone Encounter (Signed)
Pt was called and is aware of results, DOB was confirmed.  Interpreter id #[id number] 

## 2022-11-15 NOTE — Telephone Encounter (Signed)
Pt was called and is aware of results, DOB was confirmed.  Interpreter id [phone]

## 2022-11-16 ENCOUNTER — Other Ambulatory Visit: Payer: Self-pay

## 2022-11-16 ENCOUNTER — Ambulatory Visit
Admission: RE | Admit: 2022-11-16 | Discharge: 2022-11-16 | Disposition: A | Discharge status: Home / Self Care | Payer: No Typology Code available for payment source | Source: Ambulatory Visit | Attending: Internal Medicine | Admitting: Internal Medicine

## 2022-11-16 DIAGNOSIS — Z1231 Encounter for screening mammogram for malignant neoplasm of breast: Secondary | ICD-10-CM

## 2022-11-23 ENCOUNTER — Other Ambulatory Visit: Payer: Self-pay

## 2022-11-29 ENCOUNTER — Other Ambulatory Visit (INDEPENDENT_AMBULATORY_CARE_PROVIDER_SITE_OTHER): Payer: Self-pay

## 2022-11-29 ENCOUNTER — Encounter: Payer: Self-pay | Admitting: Surgical

## 2022-11-29 ENCOUNTER — Ambulatory Visit (INDEPENDENT_AMBULATORY_CARE_PROVIDER_SITE_OTHER): Payer: Self-pay | Admitting: Surgical

## 2022-11-29 DIAGNOSIS — M25562 Pain in left knee: Secondary | ICD-10-CM

## 2022-11-29 DIAGNOSIS — M25561 Pain in right knee: Secondary | ICD-10-CM

## 2022-11-29 DIAGNOSIS — M1712 Unilateral primary osteoarthritis, left knee: Secondary | ICD-10-CM

## 2022-11-29 DIAGNOSIS — M1711 Unilateral primary osteoarthritis, right knee: Secondary | ICD-10-CM

## 2022-11-29 MED ORDER — METHYLPREDNISOLONE ACETATE 40 MG/ML IJ SUSP
40.0000 mg | INTRAMUSCULAR | Status: AC | PRN
Start: 2022-11-29 — End: 2022-11-29
  Administered 2022-11-29: 40 mg via INTRA_ARTICULAR

## 2022-11-29 MED ORDER — BUPIVACAINE HCL 0.25 % IJ SOLN
4.0000 mL | INTRAMUSCULAR | Status: AC | PRN
Start: 2022-11-29 — End: 2022-11-29
  Administered 2022-11-29: 4 mL via INTRA_ARTICULAR

## 2022-11-29 MED ORDER — LIDOCAINE HCL 1 % IJ SOLN
5.0000 mL | INTRAMUSCULAR | Status: AC | PRN
Start: 2022-11-29 — End: 2022-11-29
  Administered 2022-11-29: 5 mL

## 2022-11-29 NOTE — Progress Notes (Signed)
Office Visit Note   Patient: Michele Wright           Date of Birth: 10-05-1975           MRN: [id number] Visit Date: 11/29/2022 Requested by: Marcine Matar, MD [id number] [place],  [place] [address] PCP: Marcine Matar, MD  Subjective: Chief Complaint  Patient presents with   Right Knee - Pain   Left Knee - Pain    HPI: Michele Wright is a 47 y.o. female who presents to the office reporting bilateral knee pain, right greater than left.  Patient complains of chronic pain in primarily the right knee but the left knee is bothering her more and more recently as well.  She has previously seen Dr. August Saucer back in 2019.  She was advised to do exercise bike which she does 30 minutes 3 times per week.  She feels that this is helpful for her knee pain along with taking occasional meloxicam and with sound like Tylenol.  She does not work, she used to work at [employer].  She takes care of her home currently.  Describes medial pain with no numbness or tingling, radicular pain, low back pain, groin pain.  No heart disease.  No history of DVT/PE.  No diabetes..                ROS: All systems reviewed are negative as they relate to the chief complaint within the history of present illness.  Patient denies fevers or chills.  Assessment & Plan: Visit Diagnoses:  1. Unilateral primary osteoarthritis, right knee   2. Pain in both knees, unspecified chronicity   3. Unilateral primary osteoarthritis, left knee     Plan: Patient is a 47 year old female who presents for evaluation of bilateral knee pain.  Right knee is her main concern.  She has radiographs demonstrating severe primarily medial compartment arthritis with degenerative changes throughout all 3 compartments overall.  Definitely progressed in severity of arthritis compared with 2019 when she had last radiographs.  We discussed options available to patient including living with her symptoms versus physical therapy  versus cortisone injection versus total knee replacement.  She wants to avoid knee replacement as long as possible as she understands that it will likely need revision surgery 15 to 30 years after her primary procedure.  She would like to try cortisone injection today.  Injection administered without complication.  Patient tolerated procedure well.  She will call us in several weeks to let us know how she is doing and follow-up with the office as needed if symptoms do not improve.  Follow-Up Instructions: No follow-ups on file.   Orders:  Orders Placed This Encounter  Procedures   XR KNEE 3 VIEW RIGHT   XR KNEE 3 VIEW LEFT   No orders of the defined types were placed in this encounter.     Procedures: Large Joint Inj: R knee on 11/29/2022 9:56 AM Indications: diagnostic evaluation, joint swelling and pain Details: 18 G 1.5 in needle, superolateral approach  Arthrogram: No  Medications: 5 mL lidocaine 1 %; 40 mg methylPREDNISolone acetate 40 MG/ML; 4 mL bupivacaine 0.25 % Outcome: tolerated well, no immediate complications Procedure, treatment alternatives, risks and benefits explained, specific risks discussed. Consent was given by the patient. Immediately prior to procedure a time out was called to verify the correct patient, procedure, equipment, support staff and site/side marked as required. Patient was prepped and draped in the usual sterile fashion.  Clinical Data: No additional findings.  Objective: Vital Signs: There were no vitals taken for this visit.  Physical Exam:  Constitutional: Patient appears well-developed HEENT:  Head: Normocephalic Eyes:EOM are normal Neck: Normal range of motion Cardiovascular: Normal rate Pulmonary/chest: Effort normal Neurologic: Patient is alert Skin: Skin is warm Psychiatric: Patient has normal mood and affect  Ortho Exam: Ortho exam demonstrates right knee with 3 degrees extension and 115 degrees knee flexion.  Left knee  with 0 degrees extension and 120 degrees knee flexion.  No effusion noted in either knee.  No calf tenderness.  Negative Homans' sign.  She has no cellulitis or skin changes noted.  Excellent quad strength rated 5/5 in both lower extremities.  No pain with hip range of motion bilaterally.  Right lower extremity is warm and well-perfused.  Specialty Comments:  No specialty comments available.  Imaging: No results found.   PMFS History: Patient Active Problem List   Diagnosis Date Noted   Primary osteoarthritis of both knees 11/10/2022   Chronic pain of both knees 01/09/2018   Right medial knee pain 11/11/2015   Peripheral edema 11/11/2015   Heel pain, bilateral 01/14/2014   Asthma, chronic 04/03/2013   Past Medical History:  Diagnosis Date   Asthma     Family History  Problem Relation Age of Onset   Breast cancer Sister    Breast cancer Paternal Aunt     Past Surgical History:  Procedure Laterality Date   NO PAST SURGERIES     Social History   Occupational History   Not on file  Tobacco Use   Smoking status: Never   Smokeless tobacco: Never  Vaping Use   Vaping Use: Never used  Substance and Sexual Activity   Alcohol use: Yes    Comment: occasionally   Drug use: No   Sexual activity: Not on file

## 2023-02-09 ENCOUNTER — Other Ambulatory Visit: Payer: Self-pay

## 2023-03-05 ENCOUNTER — Other Ambulatory Visit: Payer: Self-pay

## 2023-03-23 ENCOUNTER — Ambulatory Visit: Payer: Self-pay | Attending: Internal Medicine | Admitting: Internal Medicine

## 2023-03-23 ENCOUNTER — Other Ambulatory Visit: Payer: Self-pay

## 2023-03-23 ENCOUNTER — Encounter: Payer: Self-pay | Admitting: Internal Medicine

## 2023-03-23 VITALS — BP 119/75 | HR 56 | Temp 98.2°F | Ht 61.0 in | Wt 191.0 lb

## 2023-03-23 DIAGNOSIS — Z23 Encounter for immunization: Secondary | ICD-10-CM

## 2023-03-23 DIAGNOSIS — R202 Paresthesia of skin: Secondary | ICD-10-CM

## 2023-03-23 DIAGNOSIS — J4530 Mild persistent asthma, uncomplicated: Secondary | ICD-10-CM

## 2023-03-23 DIAGNOSIS — R7303 Prediabetes: Secondary | ICD-10-CM

## 2023-03-23 DIAGNOSIS — G5603 Carpal tunnel syndrome, bilateral upper limbs: Secondary | ICD-10-CM

## 2023-03-23 DIAGNOSIS — E7800 Pure hypercholesterolemia, unspecified: Secondary | ICD-10-CM

## 2023-03-23 DIAGNOSIS — Z6836 Body mass index (BMI) 36.0-36.9, adult: Secondary | ICD-10-CM

## 2023-03-23 DIAGNOSIS — E66812 Obesity, class 2: Secondary | ICD-10-CM

## 2023-03-23 MED ORDER — LORATADINE 10 MG PO TABS
10.0000 mg | ORAL_TABLET | Freq: Every day | ORAL | 11 refills | Status: DC
Start: 2023-03-23 — End: 2024-04-08
  Filled 2023-03-23: qty 30, 30d supply, fill #0

## 2023-03-23 MED ORDER — ATORVASTATIN CALCIUM 10 MG PO TABS
10.0000 mg | ORAL_TABLET | Freq: Every day | ORAL | 3 refills | Status: DC
Start: 2023-03-23 — End: 2024-04-08
  Filled 2023-03-23: qty 90, 90d supply, fill #0
  Filled 2023-08-07: qty 90, 90d supply, fill #1
  Filled 2023-11-07: qty 90, 90d supply, fill #2

## 2023-03-23 NOTE — Patient Instructions (Signed)
Sndrome del tnel carpiano Carpal Tunnel Syndrome  El sndrome del tnel carpiano es una afeccin que causa dolor, debilidad y adormecimiento en la mano y los dedos. El adormecimiento es cuando no siente una zona del cuerpo. El tnel carpiano es un rea estrecha que se encuentra en el lado palmar de la mueca. Los movimientos repetidos de la mueca o determinadas enfermedades pueden causar hinchazn en el tnel. Esta hinchazn puede comprimir el nervio principal de la mueca. Este nervio se llama "nervio mediano". Cules son las causas? Esta afeccin puede ser causada por lo siguiente: Mover la mano y la mueca una y otra vez mientras realiza una tarea. Lesin en la mueca. Artritis. Un saco lleno de lquido (quiste) o un crecimiento anormal (tumor) en el tnel carpiano. Acumulacin de lquido durante el embarazo. Uso de herramientas que vibran. Algunas veces, la causa no se conoce. Qu incrementa el riesgo? Los siguientes factores pueden hacer que sea ms propenso a tener esta afeccin: Tener un trabajo en el que deba hacer estas cosas: Mover la mano una y otra vez. Trabajar con herramientas que vibran, como taladros o lijadoras. Ser mujer. Tener diabetes, obesidad, problemas de tiroides o insuficiencia renal. Cules son los signos o sntomas? Los sntomas de esta afeccin incluyen: Sensacin de hormigueo en los dedos. Hormigueo o prdida de la sensibilidad de la mano. Dolor en todo el brazo. Este dolor puede empeorar al flexionar la mueca y el codo durante mucho tiempo. Dolor en la mueca que sube por el brazo hasta el hombro. Dolor que baja hasta la palma de la mano o los dedos. Debilidad en las manos. Puede resultarle difcil tomar y sostener objetos. Es posible que se sienta peor por la noche. Cmo se trata? El tratamiento de esta afeccin puede incluir: Cambios en el estilo de vida. Se le pedir que deje o cambie la actividad que caus el problema. Hacer ejercicios y  actividades para que los huesos, los msculos y los tendones se vuelvan ms fuertes (fisioterapia). Aprender a usar la mano nuevamente (terapia ocupacional). Medicamentos para el dolor y la hinchazn. Es posible que le apliquen inyecciones en la mueca. Una frula o un dispositivo ortopdico para la mueca. Ciruga. Siga estas instrucciones en su casa: Si tiene una frula o un dispositivo ortopdico: Use la frula o el dispositivo ortopdico como se lo haya indicado el mdico. Quteselos solamente como se lo haya indicado el mdico. Afloje la frula si los dedos: Hormiguean. Se adormecen. Se tornan fros y de color azul. Mantenga la frula o el dispositivo ortopdico limpios. Si la frula o el dispositivo ortopdico no son impermeables: No deje que se mojen. Cbralos con un envoltorio hermtico cuando tome un bao de inmersin o una ducha. Control del dolor, la rigidez y la hinchazn Si se lo indican, aplique hielo sobre la zona dolorida: Si tiene un dispositivo ortopdico o una frula desmontable, quteselos como se lo haya indicado el mdico. Ponga el hielo en una bolsa plstica. Coloque una toalla entre la piel y la bolsa. Coloque el hielo durante 20 minutos, 2 a 3 veces al da. No se quede dormido con la bolsa de hielo sobre la piel. Retire el hielo si la piel se le pone de color rojo brillante. Esto es muy importante. Si no puede sentir dolor, calor o fro, tiene un mayor riesgo de que se dae la zona. Mueva los dedos con frecuencia para reducir la rigidez y la hinchazn. Instrucciones generales Tome los medicamentos de venta libre y los recetados solamente   como se lo haya indicado el mdico. Descanse la mueca de cualquier actividad que le cause dolor. Si es necesario, hable con su jefe en el trabajo sobre los cambios que pueden ayudar a la curacin de la mueca. Haga los ejercicios que le hayan indicado el mdico, el fisioterapeuta o el terapeuta ocupacional. Cumpla con todas las  visitas de seguimiento. Comunquese con un mdico si: Aparecen nuevos sntomas. Los medicamentos no le alivian el dolor. Sus sntomas empeoran. Solicite ayuda de inmediato si: Tiene adormecimiento u hormigueo muy intensos en la mueca o la mano. Resumen El sndrome del tnel carpiano es una afeccin que causa dolor en la mano y en el brazo. Suele deberse a movimientos repetidos de la mueca. Este problema se trata mediante cambios en el estilo de vida y medicamentos. La ciruga puede ser necesaria en casos muy graves. Siga las instrucciones del mdico sobre el uso de una frula, el reposo de la mueca, la asistencia a las consultas de seguimiento y llamar para pedir ayuda. Esta informacin no tiene como fin reemplazar el consejo del mdico. Asegrese de hacerle al mdico cualquier pregunta que tenga. Document Revised: 12/19/2019 Document Reviewed: 12/19/2019 Elsevier Patient Education  2024 [employer]  

## 2023-03-23 NOTE — Progress Notes (Signed)
Patient ID: Michele Wright, female    DOB: [birth year]  MRN: [id number]  CC: Asthma (Asthma f/u. Med refill. /Numbness from waist radiating to R & L feet. /Swelling of knees radiating to legs - varicose veins /Received flu vax today 03/23/23 - C.A.)   Subjective: Michele Wright is a 47 y.o. female who presents for chronic ds management. Her concerns today include:  Pt with hx of asthma, HL, HTN, PreDM, fhx of breast CA in sister  AMN Language interpreter used during this encounter. # [id number], Michael  Asthma:  requests RF on inhalers.  On Breo and Albuterol inhalers; still has Rfs on current rxns.   HL:  taking and tolerating Lipitor.  Request Rfs.  C/o Feeling of "ants crawling from waist down and also in hands." Started 1 wk ago. Numbness in hands usually in index, middle and ring fingers.  Endorses waking at nights with numbness. Worse with use of hands symptoms intermittent  in legs.  Goes away when she stands up and walks No pain in legs with ambulation but gets swelling in knees at times.  Taking Mobic as prescribed and finds it helpful.  Still working on getting wgh down Patient Active Problem List   Diagnosis Date Noted   Primary osteoarthritis of both knees 11/10/2022   Chronic pain of both knees 01/09/2018   Right medial knee pain 11/11/2015   Peripheral edema 11/11/2015   Heel pain, bilateral 01/14/2014   Asthma, chronic 04/03/2013     Current Outpatient Medications on File Prior to Visit  Medication Sig Dispense Refill   albuterol (VENTOLIN HFA) 108 (90 Base) MCG/ACT inhaler Inhale 2 puffs into the lungs every 4 (four) hours as needed for wheezing. 6.7 g 6   atorvastatin (LIPITOR) 10 MG tablet TAKE 1 TABLET (10 MG TOTAL) BY MOUTH DAILY. 90 tablet 3   fluticasone (FLONASE) 50 MCG/ACT nasal spray Place 2 sprays into both nostrils daily. 16 g 6   fluticasone furoate-vilanterol (BREO ELLIPTA) 200-25 MCG/ACT AEPB Inhale 1 puff into the lungs daily. 60 each 6    loratadine (CLARITIN) 10 MG tablet Take 1 tablet (10 mg total) by mouth daily. 30 tablet 11   meloxicam (MOBIC) 15 MG tablet Take 1 tablet (15 mg total) by mouth daily. 30 tablet 2   montelukast (SINGULAIR) 10 MG tablet Take 1 tablet (10 mg total) by mouth at bedtime. 30 tablet 11   lisinopril (ZESTRIL) 2.5 MG tablet TAKE 2 TABLETS (5 MG TOTAL) BY MOUTH DAILY. 90 tablet 1   No current facility-administered medications on file prior to visit.    No Known Allergies  Social History   Socioeconomic History   Marital status: Single    Spouse name: Not on file   Number of children: Not on file   Years of education: Not on file   Highest education level: Not on file  Occupational History   Not on file  Tobacco Use   Smoking status: Never   Smokeless tobacco: Never  Vaping Use   Vaping status: Never Used  Substance and Sexual Activity   Alcohol use: Yes    Comment: occasionally   Drug use: No   Sexual activity: Not on file  Other Topics Concern   Not on file  Social History Narrative   Not on file   Social Determinants of Health   Financial Resource Strain: Not on file  Food Insecurity: Not on file  Transportation Needs: Not on file  Physical Activity: Not on file  Stress: Not on file  Social Connections: Not on file  Intimate Partner Violence: Not on file    Family History  Problem Relation Age of Onset   Breast cancer Sister    Breast cancer Paternal Aunt     Past Surgical History:  Procedure Laterality Date   NO PAST SURGERIES      ROS: Review of Systems Negative except as stated above  PHYSICAL EXAM: BP 119/75 (BP Location: Left Arm, Patient Position: Sitting, Cuff Size: Large)   Pulse (!) 56   Temp 98.2 F (36.8 C) (Oral)   Ht 5\' 1"  (1.549 m)   Wt 191 lb (86.6 kg)   SpO2 99%   BMI 36.09 kg/m   Wt Readings from Last 3 Encounters:  03/23/23 191 lb (86.6 kg)  11/10/22 191 lb (86.6 kg)  09/15/22 195 lb (88.5 kg)    Physical Exam  General  appearance - alert, well appearing, and in no distress Mental status - normal mood, behavior, speech, dress, motor activity, and thought processes Chest - clear to auscultation, no wheezes, rales or rhonchi, symmetric air entry Heart - normal rate, regular rhythm, normal S1, S2, no murmurs, rubs, clicks or gallops Neurological -grip 5/5 bilaterally.  No wasting of intrinsic muscles of the hands.  Tinel's sign positive bilaterally.  Gross sensation intact in both lower extremities.  Power 5/5 bilaterally in both lower extremities. Extremities - peripheral pulses normal, no pedal edema, no clubbing or cyanosis      Latest Ref Rng & Units 03/24/2022   10:39 AM 06/04/2019    8:42 AM 11/12/2017    9:35 AM  CMP  Glucose 70 - 99 mg/dL 737  106  99   BUN 6 - 24 mg/dL 13  13  12    Creatinine 0.57 - 1.00 mg/dL 2.69  4.85  4.62   Sodium 134 - 144 mmol/L 138  140  142   Potassium 3.5 - 5.2 mmol/L 4.6  4.5  4.2   Chloride 96 - 106 mmol/L 100  104  106   CO2 20 - 29 mmol/L 23  22  21    Calcium 8.7 - 10.2 mg/dL 9.5  9.3  8.9   Total Protein 6.0 - 8.5 g/dL 7.5  6.8  6.4   Total Bilirubin 0.0 - 1.2 mg/dL 0.3  <7.0  0.2   Alkaline Phos 44 - 121 IU/L 96  98  89   AST 0 - 40 IU/L 14  20  16    ALT 0 - 32 IU/L 19  27  22     Lipid Panel     Component Value Date/Time   CHOL 193 03/24/2022 1039   TRIG 135 03/24/2022 1039   HDL 55 03/24/2022 1039   CHOLHDL 3.5 03/24/2022 1039   CHOLHDL 3.4 04/23/2014 1013   VLDL 22 04/23/2014 1013   LDLCALC 114 (H) 03/24/2022 1039    CBC    Component Value Date/Time   WBC 9.2 03/24/2022 1039   WBC 8.6 04/23/2014 1013   RBC 5.17 03/24/2022 1039   RBC 4.82 04/23/2014 1013   HGB 14.1 03/24/2022 1039   HCT 43.9 03/24/2022 1039   PLT 357 03/24/2022 1039   MCV 85 03/24/2022 1039   MCH 27.3 03/24/2022 1039   MCH 28.4 04/23/2014 1013   MCHC 32.1 03/24/2022 1039   MCHC 33.8 04/23/2014 1013   RDW 13.1 03/24/2022 1039   LYMPHSABS 2.2 03/24/2022 1039   MONOABS 0.6  04/23/2014 1013   EOSABS 0.5 (H) 03/24/2022  1039   BASOSABS 0.1 03/24/2022 1039    ASSESSMENT AND PLAN: 1. Bilateral carpal tunnel syndrome Symptoms consistent with carpal tunnel syndrome.  Discussed diagnosis and management with her.  We did not have any cock up wrist splints so I gave her prescription that she can take to any medical supply store.  Let me know if no improvement with conservative measures. - For home use only DME Other see comment  2. Tingling in extremities Tingling in the legs of questionable etiology.  Will check vitamin B12 level and an A1c. - Vitamin B12  3. Mild persistent asthma without complication Refill given on Claritin per her request - loratadine (CLARITIN) 10 MG tablet; Take 1 tablet (10 mg total) by mouth daily.  Dispense: 30 tablet; Refill: 11  4. Class 2 severe obesity with serious comorbidity and body mass index (BMI) of 36.0 to 36.9 in adult, unspecified obesity type (HCC) Encourage healthy eating habits and to move is much as her knees will allow.  5. Prediabetes See #4 above - Hemoglobin A1c  6. Elevated LDL cholesterol level Continue Lipitor - atorvastatin (LIPITOR) 10 MG tablet; Take 1 tablet (10 mg total) by mouth daily.  Dispense: 90 tablet; Refill: 3  7. Need for immunization against influenza - Flu vaccine trivalent PF, 6mos and older(Flulaval,Afluria,Fluarix,Fluzone)  Patient was given the opportunity to ask questions.  Patient verbalized understanding of the plan and was able to repeat key elements of the plan.   This documentation was completed using Paediatric nurse.  Any transcriptional errors are unintentional.  Orders Placed This Encounter  Procedures   Flu vaccine trivalent PF, 6mos and older(Flulaval,Afluria,Fluarix,Fluzone)     Requested Prescriptions   Pending Prescriptions Disp Refills   atorvastatin (LIPITOR) 10 MG tablet 90 tablet 3    Sig: TAKE 1 TABLET (10 MG TOTAL) BY MOUTH DAILY.    No  follow-ups on file.  Jonah Blue, MD, FACP

## 2023-03-25 LAB — HEMOGLOBIN A1C
Est. average glucose Bld gHb Est-mCnc: 137 mg/dL
Hgb A1c MFr Bld: 6.4 % — ABNORMAL HIGH (ref 4.8–5.6)

## 2023-03-25 LAB — VITAMIN B12: Vitamin B-12: 1332 pg/mL — ABNORMAL HIGH (ref 232–1245)

## 2023-03-30 ENCOUNTER — Other Ambulatory Visit: Payer: Self-pay

## 2023-06-13 ENCOUNTER — Ambulatory Visit: Payer: Self-pay | Admitting: *Deleted

## 2023-06-13 NOTE — Telephone Encounter (Signed)
Reason for Disposition  Toothache present > 24 hours    Has orange card so needs a dental referral.  Answer Assessment - Initial Assessment Questions 1. LOCATION: "Which tooth is hurting?"  (e.g., right-side/left-side, upper/lower, front/back)     I'm having pain in one of my molars.  It's the 3rd molar on the bottom right side.    She called in with a Spanish interpreter. I got my orange card.  They told me Dr Laural Benes could refer me to a dentist.  We got disconnected so the interpreter called her back.   She answered Accidentally hung up 2. ONSET: "When did the toothache start?"  (e.g., hours, days)      Pain going on for about 3 weeks now.   3. SEVERITY: "How bad is the toothache?"  (Scale 1-10; mild, moderate or severe)   - MILD (1-3): doesn't interfere with chewing    - MODERATE (4-7): interferes with chewing, interferes with normal activities, awakens from sleep     - SEVERE (8-10): unable to eat, unable to do any normal activities, excruciating pain        Mild  4. SWELLING: "Is there any visible swelling of your face?"     No 5. OTHER SYMPTOMS: "Do you have any other symptoms?" (e.g., fever)     No 6. PREGNANCY: "Is there any chance you are pregnant?" "When was your last menstrual period?"     Not asked for this situation.  Protocols used: Toothache-A-AH

## 2023-06-13 NOTE — Telephone Encounter (Signed)
  Chief Complaint: Toothache.   Has orange card, needs dental referral Symptoms: The 3rd molar on the bottom right side is hurting.   It has a hole in it Frequency: Hurting about 3 weeks. Pertinent Negatives: Patient denies having a dentist.   Disposition: [] ED /[] Urgent Care (no appt availability in office) / [] Appointment(In office/virtual)/ []  [facility]/ [] Home Care/ [] Refused Recommended Disposition /[] [facility]/ [x]  Follow-up with PCP Additional Notes: She wasn't able to go to the [facility] today because she is keeping her grand kids.   There are no appts with [facility] until the end of Jan. 2025 with any of the providers.    She is going to call back on Monday after the Thanksgiving holiday to see about getting a dental referral.   I let her know if the pain got severe that she could go to the ED if necessary.      Spanish interpreter was Bethany [phone].

## 2023-06-20 ENCOUNTER — Telehealth: Payer: Self-pay

## 2023-06-20 NOTE — Telephone Encounter (Signed)
Copied from CRM [id number]. Topic: Referral - Request for Referral >> Jun 18, 2023 10:35 AM De Blanch wrote: Did the patient discuss referral with their provider in the last year? No  Appointment offered? No  Type of order/referral and detailed reason for visit: Toothache/Dental referral. Pt spoke with NT 11/27.  Preference of office, provider, location: N/A  If referral order, have you been seen by this specialty before? Yes  Can we respond through MyChart? No

## 2023-06-20 NOTE — Telephone Encounter (Signed)
Called & spoke to the patient. Verified name & DOB. Informed that we do not send dental referrals. List of dental offices sent via mail.  Advised patient that if pain becomes too unbearable to please visit her local urgent care for treatment until she is bale to see the dentist. Patient expressed verbal understanding.

## 2023-07-31 ENCOUNTER — Ambulatory Visit: Payer: Self-pay | Attending: Internal Medicine | Admitting: Internal Medicine

## 2023-07-31 VITALS — BP 112/72 | HR 58 | Temp 97.8°F | Ht 61.0 in | Wt 189.0 lb

## 2023-07-31 DIAGNOSIS — E66812 Obesity, class 2: Secondary | ICD-10-CM

## 2023-07-31 DIAGNOSIS — E782 Mixed hyperlipidemia: Secondary | ICD-10-CM

## 2023-07-31 DIAGNOSIS — R7303 Prediabetes: Secondary | ICD-10-CM

## 2023-07-31 DIAGNOSIS — Z23 Encounter for immunization: Secondary | ICD-10-CM

## 2023-07-31 DIAGNOSIS — J4530 Mild persistent asthma, uncomplicated: Secondary | ICD-10-CM

## 2023-07-31 DIAGNOSIS — Z6835 Body mass index (BMI) 35.0-35.9, adult: Secondary | ICD-10-CM

## 2023-07-31 NOTE — Progress Notes (Signed)
 Patient ID: Michele Wright, female    DOB: [birth year]  MRN: [id number]  CC: Follow-up (Follow-up. /No questions / concerns/)   Subjective: Michele Wright is a 48 y.o. female who presents for chronic ds management. Her concerns today include:  Pt with hx of asthma, HL, HTN, PreDM, fhx of breast CA in sister  AMN Language interpreter used during this encounter. #juan [id number]   Asthma: On Breo and albuterol  inhalers. Does not have to use Albuterol  inhaler every day.  Takes Singulair  PRN.  No recent asthma flare.  Yes to PCV vaccine  YO:Ujxpwh and tolerating atorvastatin  10 mg.  LDL last checked 03/2022 was 114.  Prediabetes/obesity: Most recent A1c was 6.4. Down 3 lbs since last visit 4 mths ago.  Admits to over eating during Christmas holidays.  Since then she has cut back on bread and tortias; no sugary drinks. Rides her stationary bike 5-6 days/wk for 30 mins  HM: Due for pneumonia vaccine.     Patient Active Problem List   Diagnosis Date Noted   Primary osteoarthritis of both knees 11/10/2022   Chronic pain of both knees 01/09/2018   Right medial knee pain 11/11/2015   Peripheral edema 11/11/2015   Heel pain, bilateral 01/14/2014   Asthma, chronic 04/03/2013     Current Outpatient Medications on File Prior to Visit  Medication Sig Dispense Refill   albuterol  (VENTOLIN  HFA) 108 (90 Base) MCG/ACT inhaler Inhale 2 puffs into the lungs every 4 (four) hours as needed for wheezing. 6.7 g 6   atorvastatin  (LIPITOR) 10 MG tablet Take 1 tablet (10 mg total) by mouth daily. 90 tablet 3   fluticasone  furoate-vilanterol (BREO ELLIPTA ) 200-25 MCG/ACT AEPB Inhale 1 puff into the lungs daily. 60 each 6   loratadine  (CLARITIN ) 10 MG tablet Take 1 tablet (10 mg total) by mouth daily. 30 tablet 11   montelukast  (SINGULAIR ) 10 MG tablet Take 1 tablet (10 mg total) by mouth at bedtime. 30 tablet 11   fluticasone  (FLONASE ) 50 MCG/ACT nasal spray Place 2 sprays into both nostrils daily.  (Patient not taking: Reported on 07/31/2023) 16 g 6   meloxicam  (MOBIC ) 15 MG tablet Take 1 tablet (15 mg total) by mouth daily. (Patient not taking: Reported on 07/31/2023) 30 tablet 2   No current facility-administered medications on file prior to visit.    No Known Allergies  Social History   Socioeconomic History   Marital status: Single    Spouse name: Not on file   Number of children: Not on file   Years of education: Not on file   Highest education level: Not on file  Occupational History   Not on file  Tobacco Use   Smoking status: Never   Smokeless tobacco: Never  Vaping Use   Vaping status: Never Used  Substance and Sexual Activity   Alcohol use: Yes    Comment: occasionally   Drug use: No   Sexual activity: Not on file  Other Topics Concern   Not on file  Social History Narrative   Not on file   Social Drivers of Health   Financial Resource Strain: Not on file  Food Insecurity: Not on file  Transportation Needs: Not on file  Physical Activity: Not on file  Stress: Not on file  Social Connections: Not on file  Intimate Partner Violence: Not on file    Family History  Problem Relation Age of Onset   Breast cancer Sister    Breast cancer Paternal Aunt  Past Surgical History:  Procedure Laterality Date   NO PAST SURGERIES      ROS: Review of Systems Negative except as stated above  PHYSICAL EXAM: BP 112/72 (BP Location: Left Arm, Patient Position: Sitting, Cuff Size: Normal)   Pulse (!) 58   Temp 97.8 F (36.6 C) (Oral)   Ht 5' 1 (1.549 m)   Wt 189 lb (85.7 kg)   SpO2 97%   BMI 35.71 kg/m   Wt Readings from Last 3 Encounters:  07/31/23 189 lb (85.7 kg)  03/23/23 191 lb (86.6 kg)  11/10/22 191 lb (86.6 kg)    Physical Exam  General appearance - alert, well appearing, and in no distress Mental status - normal mood, behavior, speech, dress, motor activity, and thought processes Chest - clear to auscultation, no wheezes, rales or  rhonchi, symmetric air entry Heart - normal rate, regular rhythm, normal S1, S2, no murmurs, rubs, clicks or gallops Extremities - peripheral pulses normal, no pedal edema, no clubbing or cyanosis.      Latest Ref Rng & Units 03/24/2022   10:39 AM 06/04/2019    8:42 AM 11/12/2017    9:35 AM  CMP  Glucose 70 - 99 mg/dL 894  888  99   BUN 6 - 24 mg/dL 13  13  12    Creatinine 0.57 - 1.00 mg/dL 9.33  9.36  9.35   Sodium 134 - 144 mmol/L 138  140  142   Potassium 3.5 - 5.2 mmol/L 4.6  4.5  4.2   Chloride 96 - 106 mmol/L 100  104  106   CO2 20 - 29 mmol/L 23  22  21    Calcium  8.7 - 10.2 mg/dL 9.5  9.3  8.9   Total Protein 6.0 - 8.5 g/dL 7.5  6.8  6.4   Total Bilirubin 0.0 - 1.2 mg/dL 0.3  <9.7  0.2   Alkaline Phos 44 - 121 IU/L 96  98  89   AST 0 - 40 IU/L 14  20  16    ALT 0 - 32 IU/L 19  27  22     Lipid Panel     Component Value Date/Time   CHOL 193 03/24/2022 1039   TRIG 135 03/24/2022 1039   HDL 55 03/24/2022 1039   CHOLHDL 3.5 03/24/2022 1039   CHOLHDL 3.4 04/23/2014 1013   VLDL 22 04/23/2014 1013   LDLCALC 114 (H) 03/24/2022 1039    CBC    Component Value Date/Time   WBC 9.2 03/24/2022 1039   WBC 8.6 04/23/2014 1013   RBC 5.17 03/24/2022 1039   RBC 4.82 04/23/2014 1013   HGB 14.1 03/24/2022 1039   HCT 43.9 03/24/2022 1039   PLT 357 03/24/2022 1039   MCV 85 03/24/2022 1039   MCH 27.3 03/24/2022 1039   MCH 28.4 04/23/2014 1013   MCHC 32.1 03/24/2022 1039   MCHC 33.8 04/23/2014 1013   RDW 13.1 03/24/2022 1039   LYMPHSABS 2.2 03/24/2022 1039   MONOABS 0.6 04/23/2014 1013   EOSABS 0.5 (H) 03/24/2022 1039   BASOSABS 0.1 03/24/2022 1039    ASSESSMENT AND PLAN: 1. Mild persistent asthma without complication (Primary) Stable Continue Breo inhaler daily and Albuterol  PRN.  2. Class 2 severe obesity due to excess calories with serious comorbidity and body mass index (BMI) of 35.0 to 35.9 in adult [facility]) Commended her on dietary changes and consistency and regular  exercise.  Encouraged her to keep up the good works. - CBC - Comprehensive metabolic panel  3. Prediabetes See #2 above - Hemoglobin A1c  4. Mixed hyperlipidemia Continue atorvastatin  10 mg daily. - Lipid panel  5. Need for vaccination against Streptococcus pneumoniae PCV 15 given today.    Patient was given the opportunity to ask questions.  Patient verbalized understanding of the plan and was able to repeat key elements of the plan.   This documentation was completed using Paediatric nurse.  Any transcriptional errors are unintentional.  No orders of the defined types were placed in this encounter.    Requested Prescriptions    No prescriptions requested or ordered in this encounter    No follow-ups on file.  Barnie Louder, MD, FACP

## 2023-08-01 LAB — CBC
Hematocrit: 40 % (ref 34.0–46.6)
Hemoglobin: 12.9 g/dL (ref 11.1–15.9)
MCH: 27.6 pg (ref 26.6–33.0)
MCHC: 32.3 g/dL (ref 31.5–35.7)
MCV: 86 fL (ref 79–97)
Platelets: 276 10*3/uL (ref 150–450)
RBC: 4.67 x10E6/uL (ref 3.77–5.28)
RDW: 12.7 % (ref 11.7–15.4)
WBC: 8.7 10*3/uL (ref 3.4–10.8)

## 2023-08-01 LAB — COMPREHENSIVE METABOLIC PANEL
ALT: 18 [IU]/L (ref 0–32)
AST: 19 [IU]/L (ref 0–40)
Albumin: 4 g/dL (ref 3.9–4.9)
Alkaline Phosphatase: 87 [IU]/L (ref 44–121)
BUN/Creatinine Ratio: 20 (ref 9–23)
BUN: 11 mg/dL (ref 6–24)
Bilirubin Total: 0.2 mg/dL (ref 0.0–1.2)
CO2: 21 mmol/L (ref 20–29)
Calcium: 8.9 mg/dL (ref 8.7–10.2)
Chloride: 103 mmol/L (ref 96–106)
Creatinine, Ser: 0.54 mg/dL — ABNORMAL LOW (ref 0.57–1.00)
Globulin, Total: 2.9 g/dL (ref 1.5–4.5)
Glucose: 95 mg/dL (ref 70–99)
Potassium: 4.5 mmol/L (ref 3.5–5.2)
Sodium: 140 mmol/L (ref 134–144)
Total Protein: 6.9 g/dL (ref 6.0–8.5)
eGFR: 113 mL/min/{1.73_m2} (ref >59)

## 2023-08-01 LAB — HEMOGLOBIN A1C
Est. average glucose Bld gHb Est-mCnc: 123 mg/dL
Hgb A1c MFr Bld: 5.9 % — ABNORMAL HIGH (ref 4.8–5.6)

## 2023-08-01 LAB — LIPID PANEL
Chol/HDL Ratio: 3.1 {ratio} (ref 0.0–4.4)
Cholesterol, Total: 188 mg/dL (ref 100–199)
HDL: 60 mg/dL (ref >39)
LDL Chol Calc (NIH): 106 mg/dL — ABNORMAL HIGH (ref 0–99)
Triglycerides: 127 mg/dL (ref 0–149)
VLDL Cholesterol Cal: 22 mg/dL (ref 5–40)

## 2023-08-07 ENCOUNTER — Other Ambulatory Visit: Payer: Self-pay

## 2023-08-07 ENCOUNTER — Other Ambulatory Visit (INDEPENDENT_AMBULATORY_CARE_PROVIDER_SITE_OTHER): Payer: Self-pay | Admitting: Primary Care

## 2023-08-07 ENCOUNTER — Other Ambulatory Visit: Payer: Self-pay | Admitting: Internal Medicine

## 2023-08-07 DIAGNOSIS — Z760 Encounter for issue of repeat prescription: Secondary | ICD-10-CM

## 2023-08-07 DIAGNOSIS — J4520 Mild intermittent asthma, uncomplicated: Secondary | ICD-10-CM

## 2023-08-07 DIAGNOSIS — M170 Bilateral primary osteoarthritis of knee: Secondary | ICD-10-CM

## 2023-08-07 MED ORDER — MONTELUKAST SODIUM 10 MG PO TABS
10.0000 mg | ORAL_TABLET | Freq: Every day | ORAL | 11 refills | Status: AC
  Filled 2023-08-07: qty 30, 30d supply, fill #0
  Filled 2023-11-07: qty 30, 30d supply, fill #1

## 2023-08-07 NOTE — Telephone Encounter (Signed)
Requested Prescriptions  Pending Prescriptions Disp Refills   montelukast (SINGULAIR) 10 MG tablet 30 tablet 11    Sig: Take 1 tablet (10 mg total) by mouth at bedtime.     Pulmonology:  Leukotriene Inhibitors Passed - 08/07/2023  2:04 PM      Passed - Valid encounter within last 12 months    Recent Outpatient Visits           1 week ago Mild persistent asthma without complication   [facility] [place] Marcine Matar, MD   4 months ago Bilateral carpal tunnel syndrome   [place] [facility] [place]. [facility] Marcine Matar, MD   9 months ago Pap smear for cervical cancer screening   [facility] [facility] [place] Marcine Matar, MD   10 months ago Encounter to establish care   [place] [facility] [place] [facility] [place]. [facility] Marcine Matar, MD   1 year ago Prediabetes   [facility] Grayce Sessions, NP       Future Appointments             In 3 months Laural Benes Binnie Rail, MD [facility] [place] [facility] Of [place] Bridegroom. [facility]

## 2023-08-07 NOTE — Telephone Encounter (Signed)
No longer listed on current medication list Requested Prescriptions  Pending Prescriptions Disp Refills   meloxicam (MOBIC) 15 MG tablet 30 tablet 2    Sig: Take 1 tablet (15 mg total) by mouth daily.     Analgesics:  COX2 Inhibitors Failed - 08/07/2023  2:05 PM      Failed - Manual Review: Labs are only required if the patient has taken medication for more than 8 weeks.      Failed - Cr in normal range and within 360 days    Creat  Date Value Ref Range Status  04/23/2014 0.62 0.50 - 1.10 mg/dL Final   Creatinine, Ser  Date Value Ref Range Status  07/31/2023 0.54 (L) 0.57 - 1.00 mg/dL Final         Passed - HGB in normal range and within 360 days    Hemoglobin  Date Value Ref Range Status  07/31/2023 12.9 11.1 - 15.9 g/dL Final         Passed - HCT in normal range and within 360 days    Hematocrit  Date Value Ref Range Status  07/31/2023 40.0 34.0 - 46.6 % Final         Passed - AST in normal range and within 360 days    AST  Date Value Ref Range Status  07/31/2023 19 0 - 40 IU/L Final         Passed - ALT in normal range and within 360 days    ALT  Date Value Ref Range Status  07/31/2023 18 0 - 32 IU/L Final         Passed - eGFR is 30 or above and within 360 days    GFR, Est African American  Date Value Ref Range Status  04/23/2014 >89 mL/min Final   GFR calc Af Amer  Date Value Ref Range Status  06/04/2019 125 >59 mL/min/1.73 Final   GFR, Est Non African American  Date Value Ref Range Status  04/23/2014 >89 mL/min Final    Comment:      The estimated GFR is a calculation valid for adults (>=34 years old) that uses the CKD-EPI algorithm to adjust for age and sex. It is   not to be used for children, pregnant women, hospitalized patients,    patients on dialysis, or with rapidly changing kidney function. According to the NKDEP, eGFR >89 is normal, 60-89 shows mild impairment, 30-59 shows moderate impairment, 15-29 shows severe impairment and <15 is  ESRD.     GFR calc non Af Amer  Date Value Ref Range Status  06/04/2019 109 >59 mL/min/1.73 Final   eGFR  Date Value Ref Range Status  07/31/2023 113 >59 mL/min/1.73 Final         Passed - Patient is not pregnant      Passed - Valid encounter within last 12 months    Recent Outpatient Visits           1 week ago Mild persistent asthma without complication   [place] [facility] [place]. [facility] Marcine Matar, MD   4 months ago Bilateral carpal tunnel syndrome   [place] [facility] [place]. [facility] Marcine Matar, MD   9 months ago Pap smear for cervical cancer screening   [place] [facility] [place] [facility] Of . [facility] Marcine Matar, MD   10 months ago Encounter  to establish care   [place] [facility] [place]. [facility] Marcine Matar, MD   1 year ago Prediabetes   [place] [facility] Grayce Sessions, NP       Future Appointments             In 3 months Laural Benes Binnie Rail, MD [facility] [place] - A Dept Of [place] Bridegroom. [facility]

## 2023-08-08 ENCOUNTER — Other Ambulatory Visit: Payer: Self-pay

## 2023-11-07 ENCOUNTER — Other Ambulatory Visit: Payer: Self-pay

## 2023-11-12 ENCOUNTER — Other Ambulatory Visit: Payer: Self-pay

## 2023-11-12 ENCOUNTER — Telehealth: Payer: Self-pay

## 2023-11-12 NOTE — Telephone Encounter (Signed)
Submitted application for BREO ELLIPTA to [employer] for patient assistance.   Phone: [phone]

## 2023-11-19 ENCOUNTER — Other Ambulatory Visit: Payer: Self-pay

## 2023-11-20 ENCOUNTER — Other Ambulatory Visit: Payer: Self-pay

## 2023-11-21 ENCOUNTER — Other Ambulatory Visit: Payer: Self-pay

## 2023-11-21 ENCOUNTER — Telehealth: Payer: Self-pay

## 2023-11-21 NOTE — Telephone Encounter (Signed)
 Received notification from [employer] regarding approval for BREO ELLIPTA  200MCG/25MCG. Patient assistance approved from 11/19/2023 to 11/18/2024.  Medication will ship to [address], [id number]  Pt ID: [id number]  Company phone: [phone]

## 2023-11-30 ENCOUNTER — Other Ambulatory Visit: Payer: Self-pay

## 2023-11-30 ENCOUNTER — Other Ambulatory Visit: Payer: Self-pay | Admitting: Internal Medicine

## 2023-11-30 DIAGNOSIS — J4530 Mild persistent asthma, uncomplicated: Secondary | ICD-10-CM

## 2023-11-30 MED ORDER — FLUTICASONE FUROATE-VILANTEROL 200-25 MCG/ACT IN AEPB
1.0000 | INHALATION_SPRAY | Freq: Every day | RESPIRATORY_TRACT | 6 refills | Status: AC
Start: 2023-11-30 — End: ?
  Filled 2023-11-30: qty 180, 90d supply, fill #0

## 2023-12-04 ENCOUNTER — Encounter: Payer: Self-pay | Admitting: Internal Medicine

## 2023-12-04 ENCOUNTER — Ambulatory Visit: Payer: Self-pay | Attending: Internal Medicine | Admitting: Internal Medicine

## 2023-12-04 VITALS — BP 114/71 | HR 64 | Temp 97.9°F | Ht 61.0 in | Wt 179.0 lb

## 2023-12-04 DIAGNOSIS — R42 Dizziness and giddiness: Secondary | ICD-10-CM

## 2023-12-04 DIAGNOSIS — E6609 Other obesity due to excess calories: Secondary | ICD-10-CM

## 2023-12-04 DIAGNOSIS — Z1231 Encounter for screening mammogram for malignant neoplasm of breast: Secondary | ICD-10-CM

## 2023-12-04 DIAGNOSIS — N926 Irregular menstruation, unspecified: Secondary | ICD-10-CM

## 2023-12-04 DIAGNOSIS — Z1211 Encounter for screening for malignant neoplasm of colon: Secondary | ICD-10-CM

## 2023-12-04 DIAGNOSIS — E782 Mixed hyperlipidemia: Secondary | ICD-10-CM

## 2023-12-04 DIAGNOSIS — E66811 Obesity, class 1: Secondary | ICD-10-CM

## 2023-12-04 DIAGNOSIS — Z6833 Body mass index (BMI) 33.0-33.9, adult: Secondary | ICD-10-CM

## 2023-12-04 DIAGNOSIS — J4530 Mild persistent asthma, uncomplicated: Secondary | ICD-10-CM

## 2023-12-04 DIAGNOSIS — M25562 Pain in left knee: Secondary | ICD-10-CM

## 2023-12-04 NOTE — Patient Instructions (Signed)
 VISIT SUMMARY:  Today, you were seen for left knee pain, menstrual irregularities, dizziness, and follow-up on your cholesterol, prediabetes, and asthma management. We discussed your symptoms and made plans for further evaluation and management.  YOUR PLAN:  -LEFT KNEE PAIN: You have acute left knee pain with a history of mild arthritis. Arthritis is a condition that causes inflammation and pain in the joints. We will refer you to orthopedics for further evaluation and recommend taking ibuprofen  as needed for pain relief.  -MENSTRUAL IRREGULARITIES: Your irregular menstrual cycles with heavy clotting and abdominal pain may be due to perimenopausal changes, which is the transition period before menopause. We will order hormone level tests, and if the results are normal, we will refer you for a pelvic ultrasound.  -DIZZINESS: Your intermittent dizziness may be due to anemia or dehydration. Anemia is a condition where you don't have enough healthy red blood cells, and dehydration occurs when you don't have enough fluids in your body. We will check your blood count for anemia and advise you to drink plenty of fluids and change positions slowly.  -HYPERLIPIDEMIA: Your LDL cholesterol level is slightly above the goal. Hyperlipidemia is a condition where there are high levels of fats in the blood. Continue taking atorvastatin  10 mg daily to manage your cholesterol levels.  -PREDIABETES: You have prediabetes, which means your blood sugar levels are higher than normal but not high enough to be classified as diabetes. Continue with your current diet and exercise routine, as you have been doing well with recent weight loss and regular exercise.  -ASTHMA: Your asthma is well-managed with your current medications, Breo and Singulair . Asthma is a condition that affects your airways and makes it difficult to breathe. Continue taking your medications as prescribed.  INSTRUCTIONS:  1. Follow up with orthopedics  for your left knee pain. 2. Get hormone level tests done for your menstrual irregularities. 3. If hormone levels are normal, schedule a pelvic ultrasound. 4. Get a blood count test to check for anemia. 5. Ensure you are drinking plenty of fluids and change positions slowly to help with dizziness. 6. Continue taking atorvastatin  10 mg daily for your cholesterol. 7. Maintain your current diet and exercise routine for prediabetes management. 8. Continue taking Breo and Singulair  for asthma management.

## 2023-12-04 NOTE — Progress Notes (Signed)
 Patient ID: Michele Wright, female    DOB: May 11, 1976  MRN: [id number]  CC: Follow-up (Follow-up. /Requesting referral for knee - feels that knee is "locking")   Subjective: Michele Wright is a 48 y.o. female who presents for chronic ds management. Her concerns today include:  Pt with hx of asthma, HL, HTN, PreDM, fhx of breast CA in sister   AMN Language interpreter used during this encounter. #Aldo [phone], [place] #[phone]   Discussed the use of AI scribe software for clinical note transcription with the patient, who gave verbal consent to proceed.  History of Present Illness Michele Wright is a 48 year old female with mild arthritis who presents with left knee pain.  She experienced significant left knee pain last night, worsened by walking and bending, which interfered with getting into bed. Tylenol  provided some relief. Currently, there is no pain, but a cracking sensation occurs when bending the knee. Mild arthritis in the left knee was diagnosed in 2019, with fluid removal at that time. No knee injections have been administered.  HL: She takes atorvastatin  10 mg daily for cholesterol management, with an LDL level of 106 mg/dL four months ago.   Obesity/PreDM: She has prediabetes and has lost 10 pounds in four months by reducing bread and tortilla intake. She exercises three to four times a week, primarily cycling for 30 to 40 minutes per session.  In regards to her asthma, she has not had any major flareups.  Continues to use Breo and Singulair .  She experiences mild dizziness for the past two weeks, described as a 'dizzy wave' while standing or sitting, lasting a few seconds, unrelated to positional changes. Menstrual cycles have been irregular for three months, with periods occurring twice a month, initially with heavy clotting and abdominal pain. Bleeding duration has decreased recently. An IUD has been in place for three years, with no concern about  pregnancy.    Patient Active Problem List   Diagnosis Date Noted   Primary osteoarthritis of both knees 11/10/2022   Chronic pain of both knees 01/09/2018   Right medial knee pain 11/11/2015   Peripheral edema 11/11/2015   Heel pain, bilateral 01/14/2014   Asthma, chronic 04/03/2013     Current Outpatient Medications on File Prior to Visit  Medication Sig Dispense Refill   albuterol  (VENTOLIN  HFA) 108 (90 Base) MCG/ACT inhaler Inhale 2 puffs into the lungs every 4 (four) hours as needed for wheezing. 6.7 g 6   atorvastatin  (LIPITOR) 10 MG tablet Take 1 tablet (10 mg total) by mouth daily. 90 tablet 3   fluticasone  (FLONASE ) 50 MCG/ACT nasal spray Place 2 sprays into both nostrils daily. 16 g 6   fluticasone  furoate-vilanterol (BREO ELLIPTA ) 200-25 MCG/ACT AEPB Inhale 1 puff into the lungs daily. 60 each 6   loratadine  (CLARITIN ) 10 MG tablet Take 1 tablet (10 mg total) by mouth daily. 30 tablet 11   montelukast  (SINGULAIR ) 10 MG tablet Take 1 tablet (10 mg total) by mouth at bedtime. 30 tablet 11   No current facility-administered medications on file prior to visit.    No Known Allergies  Social History   Socioeconomic History   Marital status: Married    Spouse name: Not on file   Number of children: Not on file   Years of education: Not on file   Highest education level: Not on file  Occupational History   Not on file  Tobacco Use   Smoking status: Never   Smokeless tobacco: Never  Vaping Use   Vaping status: Never Used  Substance and Sexual Activity   Alcohol use: Yes    Comment: occasionally   Drug use: No   Sexual activity: Not on file  Other Topics Concern   Not on file  Social History Narrative   Not on file   Social Drivers of Health   Financial Resource Strain: Low Risk  (07/31/2023)   Overall Financial Resource Strain (CARDIA)    Difficulty of Paying Living Expenses: Not very hard  Food Insecurity: No Food Insecurity (07/31/2023)   Hunger Vital Sign     Worried About Running Out of Food in the Last Year: Never true    Ran Out of Food in the Last Year: Never true  Transportation Needs: No Transportation Needs (07/31/2023)   PRAPARE - Administrator, Civil Service (Medical): No    Lack of Transportation (Non-Medical): No  Physical Activity: Inactive (07/31/2023)   Exercise Vital Sign    Days of Exercise per Week: 0 days    Minutes of Exercise per Session: 0 min  Stress: No Stress Concern Present (07/31/2023)   [employer] of Occupational Health - Occupational Stress Questionnaire    Feeling of Stress : Not at all  Social Connections: Socially Integrated (07/31/2023)   Social Connection and Isolation Panel [NHANES]    Frequency of Communication with Friends and Family: More than three times a week    Frequency of Social Gatherings with Friends and Family: Once a week    Attends Religious Services: More than 4 times per year    Active Member of [employer] or Organizations: Yes    Attends Banker Meetings: 1 to 4 times per year    Marital Status: Married  Catering manager Violence: Not At Risk (07/31/2023)   Humiliation, Afraid, Rape, and Kick questionnaire    Fear of Current or Ex-Partner: No    Emotionally Abused: No    Physically Abused: No    Sexually Abused: No    Family History  Problem Relation Age of Onset   Breast cancer Sister    Breast cancer Paternal Aunt     Past Surgical History:  Procedure Laterality Date   NO PAST SURGERIES      ROS: Review of Systems Negative except as stated above  PHYSICAL EXAM: BP 115/74 (BP Location: Left Arm, Patient Position: Sitting, Cuff Size: Normal)   Pulse (!) 58   Temp 97.9 F (36.6 C) (Oral)   Ht 5\' 1"  (1.549 m)   Wt 179 lb (81.2 kg)   SpO2 98%   BMI 33.82 kg/m   Wt Readings from Last 3 Encounters:  12/04/23 179 lb (81.2 kg)  07/31/23 189 lb (85.7 kg)  03/23/23 191 lb (86.6 kg)    Physical Exam  General appearance - alert, well appearing,  and in no distress Mental status - normal mood, behavior, speech, dress, motor activity, and thought processes Eyes - pink conjunctiva Mouth - mucous membranes moist, pharynx normal without lesions Chest - clear to auscultation, no wheezes, rales or rhonchi, symmetric air entry Heart - normal rate, regular rhythm, normal S1, S2, no murmurs, rubs, clicks or gallops Abdomen - soft, nontender, nondistended, no masses or organomegaly Musculoskeletal - LT Knee: No edema or erythema.  Mild discomfort on palpation of the lateral joint line.  Mild discomfort with passive range of motion.  Knee has a clicking sound with some crepitus with passive range of motion. Extremities - peripheral pulses normal, no pedal edema,  no clubbing or cyanosis      Latest Ref Rng & Units 07/31/2023    9:25 AM 03/24/2022   10:39 AM 06/04/2019    8:42 AM  CMP  Glucose 70 - 99 mg/dL 95  161  096   BUN 6 - 24 mg/dL 11  13  13    Creatinine 0.57 - 1.00 mg/dL 0.45  4.09  8.11   Sodium 134 - 144 mmol/L 140  138  140   Potassium 3.5 - 5.2 mmol/L 4.5  4.6  4.5   Chloride 96 - 106 mmol/L 103  100  104   CO2 20 - 29 mmol/L 21  23  22    Calcium  8.7 - 10.2 mg/dL 8.9  9.5  9.3   Total Protein 6.0 - 8.5 g/dL 6.9  7.5  6.8   Total Bilirubin 0.0 - 1.2 mg/dL 0.2  0.3  <9.1   Alkaline Phos 44 - 121 IU/L 87  96  98   AST 0 - 40 IU/L 19  14  20    ALT 0 - 32 IU/L 18  19  27     Lipid Panel     Component Value Date/Time   CHOL 188 07/31/2023 0925   TRIG 127 07/31/2023 0925   HDL 60 07/31/2023 0925   CHOLHDL 3.1 07/31/2023 0925   CHOLHDL 3.4 04/23/2014 1013   VLDL 22 04/23/2014 1013   LDLCALC 106 (H) 07/31/2023 0925    CBC    Component Value Date/Time   WBC 8.7 07/31/2023 0925   WBC 8.6 04/23/2014 1013   RBC 4.67 07/31/2023 0925   RBC 4.82 04/23/2014 1013   HGB 12.9 07/31/2023 0925   HCT 40.0 07/31/2023 0925   PLT 276 07/31/2023 0925   MCV 86 07/31/2023 0925   MCH 27.6 07/31/2023 0925   MCH 28.4 04/23/2014 1013    MCHC 32.3 07/31/2023 0925   MCHC 33.8 04/23/2014 1013   RDW 12.7 07/31/2023 0925   LYMPHSABS 2.2 03/24/2022 1039   MONOABS 0.6 04/23/2014 1013   EOSABS 0.5 (H) 03/24/2022 1039   BASOSABS 0.1 03/24/2022 1039    ASSESSMENT AND PLAN: 1. Acute pain of left knee (Primary) -Advised to use ibuprofen  over-the-counter as needed. - AMB referral to orthopedics  2. Dizziness Intermittent dizziness not related to positional changes. Possible anemia or dehydration. - Check blood count for anemia. - Advise adequate fluid intake. - Advise slow position changes.  - CBC  3. Mixed hyperlipidemia LDL cholesterol at 106 mg/dL, goal less than 478 mg/dL. Managed with atorvastatin . - Continue atorvastatin  10 mg daily.  4. Class 1 obesity due to excess calories with serious comorbidity and body mass index (BMI) of 33.0 to 33.9 in adult Commended her on weight loss.  Encouraged her to continue healthy eating habits and regular exercise.  5. Mild persistent asthma without complication Well-managed on Breo and Singulair   6. Screening for colon cancer - Fecal occult blood, imunochemical([facility])  7. Encounter for screening mammogram for malignant neoplasm of breast - MS 3D SCR MAMMO BILAT BR (aka MM); Future  8. Irregular menses Patient may be perimenopausal.  We will check hormone levels today and consider pelvic ultrasound. - FSH/LH - TSH - Prolactin   Patient was given the opportunity to ask questions.  Patient verbalized understanding of the plan and was able to repeat key elements of the plan.   This documentation was completed using Paediatric nurse.  Any transcriptional errors are unintentional.  Orders Placed This Encounter  Procedures  Fecal occult blood, imunochemical([facility])   MM 3D SCREENING MAMMOGRAM BILATERAL BREAST     Requested Prescriptions    No prescriptions requested or ordered in this encounter    No follow-ups on  file.  Concetta Dee, MD, FACP

## 2023-12-05 ENCOUNTER — Ambulatory Visit: Payer: Self-pay | Admitting: Internal Medicine

## 2023-12-05 DIAGNOSIS — N926 Irregular menstruation, unspecified: Secondary | ICD-10-CM

## 2023-12-05 LAB — CBC
Hematocrit: 39.8 % (ref 34.0–46.6)
Hemoglobin: 12.7 g/dL (ref 11.1–15.9)
MCH: 28.3 pg (ref 26.6–33.0)
MCHC: 31.9 g/dL (ref 31.5–35.7)
MCV: 89 fL (ref 79–97)
Platelets: 262 10*3/uL (ref 150–450)
RBC: 4.49 x10E6/uL (ref 3.77–5.28)
RDW: 12.5 % (ref 11.7–15.4)
WBC: 6.9 10*3/uL (ref 3.4–10.8)

## 2023-12-05 LAB — TSH: TSH: 1.76 u[IU]/mL (ref 0.450–4.500)

## 2023-12-05 LAB — PROLACTIN: Prolactin: 10.1 ng/mL (ref 4.8–33.4)

## 2023-12-05 LAB — FSH/LH
FSH: 13 m[IU]/mL
LH: 21.2 m[IU]/mL

## 2023-12-06 LAB — FECAL OCCULT BLOOD, IMMUNOCHEMICAL: Fecal Occult Bld: NEGATIVE

## 2023-12-18 ENCOUNTER — Ambulatory Visit (HOSPITAL_COMMUNITY)
Admission: RE | Admit: 2023-12-18 | Discharge: 2023-12-18 | Disposition: A | Discharge status: Home / Self Care | Payer: Self-pay | Source: Ambulatory Visit | Attending: Internal Medicine | Admitting: Internal Medicine

## 2023-12-18 DIAGNOSIS — N926 Irregular menstruation, unspecified: Secondary | ICD-10-CM | POA: Insufficient documentation

## 2023-12-21 ENCOUNTER — Other Ambulatory Visit: Payer: Self-pay

## 2023-12-21 ENCOUNTER — Encounter: Payer: Self-pay | Admitting: Orthopedic Surgery

## 2023-12-21 ENCOUNTER — Ambulatory Visit: Payer: Self-pay | Admitting: Orthopedic Surgery

## 2023-12-21 DIAGNOSIS — M1711 Unilateral primary osteoarthritis, right knee: Secondary | ICD-10-CM

## 2023-12-21 DIAGNOSIS — M1712 Unilateral primary osteoarthritis, left knee: Secondary | ICD-10-CM

## 2023-12-21 DIAGNOSIS — M170 Bilateral primary osteoarthritis of knee: Secondary | ICD-10-CM

## 2023-12-21 MED ORDER — TRIAMCINOLONE ACETONIDE 40 MG/ML IJ SUSP
40.0000 mg | INTRAMUSCULAR | Status: AC | PRN
  Administered 2023-12-21: 40 mg via INTRA_ARTICULAR

## 2023-12-21 MED ORDER — LIDOCAINE HCL 1 % IJ SOLN
5.0000 mL | INTRAMUSCULAR | Status: AC | PRN
  Administered 2023-12-21: 5 mL

## 2023-12-21 MED ORDER — BUPIVACAINE HCL 0.25 % IJ SOLN
4.0000 mL | INTRAMUSCULAR | Status: AC | PRN
  Administered 2023-12-21: 4 mL via INTRA_ARTICULAR

## 2023-12-21 NOTE — Progress Notes (Signed)
 Office Visit Note   Patient: Michele Wright           Date of Birth: Nov 14, 1975           MRN: [id number] Visit Date: 12/21/2023 Requested by: Lawrance Presume, MD [address] [place] [address] [place],  [place] [address] PCP: Lawrance Presume, MD  Subjective: Chief Complaint  Patient presents with   Right Knee - Pain   Left Knee - Pain    HPI: Michele Wright is a 48 y.o. female who presents to the office reporting bilateral knee pain.  Generally reports pain in both knees worse with ambulation.  She is not working.  Right knee hurts her more than the left.  Wakes her up from sleep at night.  She has about 10 minutes of standing endurance.  Takes Tylenol  for symptoms..                ROS: All systems reviewed are negative as they relate to the chief complaint within the history of present illness.  Patient denies fevers or chills.  Assessment & Plan: Visit Diagnoses:  1. Unilateral primary osteoarthritis, right knee   2. Unilateral primary osteoarthritis, left knee     Plan: Impression is bilateral knee arthritis.  Bilateral knee injections performed today.  I think she is heading for knee replacement sometime in the future.  She will let us  know when that is the case.  Left knee also aspirated today.  Follow-up as needed.  Nonweightbearing quad strengthening exercises encouraged.  Follow-Up Instructions: No follow-ups on file.   Orders:  Orders Placed This Encounter  Procedures   XR Knee 1-2 Views Right   XR KNEE 3 VIEW LEFT   No orders of the defined types were placed in this encounter.     Procedures: Large Joint Inj: bilateral knee on 12/21/2023 9:03 AM Indications: diagnostic evaluation, joint swelling and pain Details: 18 G 1.5 in needle, superolateral approach  Arthrogram: No  Medications (Right): 5 mL lidocaine  1 %; 4 mL bupivacaine  0.25 %; 40 mg triamcinolone acetonide 40 MG/ML Medications (Left): 5 mL lidocaine  1 %; 4 mL bupivacaine  0.25 %; 40 mg  triamcinolone acetonide 40 MG/ML Outcome: tolerated well, no immediate complications Procedure, treatment alternatives, risks and benefits explained, specific risks discussed. Consent was given by the patient. Immediately prior to procedure a time out was called to verify the correct patient, procedure, equipment, support staff and site/side marked as required. Patient was prepped and draped in the usual sterile fashion.       Clinical Data: No additional findings.  Objective: Vital Signs: There were no vitals taken for this visit.  Physical Exam:  Constitutional: Patient appears well-developed HEENT:  Head: Normocephalic Eyes:EOM are normal Neck: Normal range of motion Cardiovascular: Normal rate Pulmonary/chest: Effort normal Neurologic: Patient is alert Skin: Skin is warm Psychiatric: Patient has normal mood and affect  Ortho Exam: Bilateral knee exam demonstrates range of motion of about 5-1 15.  Collateral cruciate ligaments are stable.  No masses adenopathy or skin changes noted in bilateral knee region.  Extensor mechanism intact.  Mild effusion left knee no effusion right knee  Specialty Comments:  No specialty comments available.  Imaging: XR Knee 1-2 Views Right Result Date: 12/21/2023 AP lateral merchant radiographs right knee reviewed.  Severe tricompartmental arthritis is present with severe in the medial compartment where there is bone-on-bone changes.  Moderate varus alignment is present.  No acute fracture    PMFS History: Patient Active Problem  List   Diagnosis Date Noted   Primary osteoarthritis of both knees 11/10/2022   Chronic pain of both knees 01/09/2018   Right medial knee pain 11/11/2015   Peripheral edema 11/11/2015   Heel pain, bilateral 01/14/2014   Asthma, chronic 04/03/2013   Past Medical History:  Diagnosis Date   Asthma     Family History  Problem Relation Age of Onset   Breast cancer Sister    Breast cancer Paternal Aunt     Past  Surgical History:  Procedure Laterality Date   NO PAST SURGERIES     Social History   Occupational History   Not on file  Tobacco Use   Smoking status: Never   Smokeless tobacco: Never  Vaping Use   Vaping status: Never Used  Substance and Sexual Activity   Alcohol use: Yes    Comment: occasionally   Drug use: No   Sexual activity: Not on file

## 2023-12-24 ENCOUNTER — Ambulatory Visit: Payer: Self-pay | Admitting: Internal Medicine

## 2023-12-28 NOTE — Telephone Encounter (Signed)
 Copied from CRM [id number]. Topic: General - Call Back - No Documentation >> Dec 28, 2023  2:52 PM Zipporah Him wrote:  Reason for CRM: Patient called back to speak with Clarisa. Please return call when available.

## 2023-12-29 ENCOUNTER — Other Ambulatory Visit: Payer: Self-pay | Admitting: Internal Medicine

## 2023-12-29 DIAGNOSIS — D219 Benign neoplasm of connective and other soft tissue, unspecified: Secondary | ICD-10-CM

## 2023-12-29 DIAGNOSIS — N939 Abnormal uterine and vaginal bleeding, unspecified: Secondary | ICD-10-CM

## 2024-02-07 ENCOUNTER — Ambulatory Visit
Admission: RE | Admit: 2024-02-07 | Discharge: 2024-02-07 | Disposition: A | Discharge status: Home / Self Care | Payer: Self-pay | Source: Ambulatory Visit | Attending: Internal Medicine | Admitting: Internal Medicine

## 2024-02-07 DIAGNOSIS — Z1231 Encounter for screening mammogram for malignant neoplasm of breast: Secondary | ICD-10-CM

## 2024-02-18 ENCOUNTER — Other Ambulatory Visit: Payer: Self-pay

## 2024-04-08 ENCOUNTER — Encounter: Payer: Self-pay | Admitting: Internal Medicine

## 2024-04-08 ENCOUNTER — Other Ambulatory Visit: Payer: Self-pay

## 2024-04-08 ENCOUNTER — Ambulatory Visit: Payer: Self-pay | Attending: Internal Medicine | Admitting: Internal Medicine

## 2024-04-08 VITALS — BP 127/77 | HR 53 | Ht 61.0 in | Wt 179.0 lb

## 2024-04-08 DIAGNOSIS — D219 Benign neoplasm of connective and other soft tissue, unspecified: Secondary | ICD-10-CM

## 2024-04-08 DIAGNOSIS — J4530 Mild persistent asthma, uncomplicated: Secondary | ICD-10-CM

## 2024-04-08 DIAGNOSIS — E6609 Other obesity due to excess calories: Secondary | ICD-10-CM

## 2024-04-08 DIAGNOSIS — N926 Irregular menstruation, unspecified: Secondary | ICD-10-CM

## 2024-04-08 DIAGNOSIS — Z6833 Body mass index (BMI) 33.0-33.9, adult: Secondary | ICD-10-CM

## 2024-04-08 DIAGNOSIS — D259 Leiomyoma of uterus, unspecified: Secondary | ICD-10-CM

## 2024-04-08 DIAGNOSIS — Z23 Encounter for immunization: Secondary | ICD-10-CM

## 2024-04-08 DIAGNOSIS — Z7951 Long term (current) use of inhaled steroids: Secondary | ICD-10-CM

## 2024-04-08 DIAGNOSIS — E7800 Pure hypercholesterolemia, unspecified: Secondary | ICD-10-CM

## 2024-04-08 DIAGNOSIS — Z79899 Other long term (current) drug therapy: Secondary | ICD-10-CM

## 2024-04-08 DIAGNOSIS — E66811 Obesity, class 1: Secondary | ICD-10-CM

## 2024-04-08 MED ORDER — LORATADINE 10 MG PO TABS
10.0000 mg | ORAL_TABLET | Freq: Every day | ORAL | 1 refills | Status: AC
  Filled 2024-04-08: qty 30, 30d supply, fill #0

## 2024-04-08 MED ORDER — ATORVASTATIN CALCIUM 10 MG PO TABS
10.0000 mg | ORAL_TABLET | Freq: Every day | ORAL | 3 refills | Status: AC
  Filled 2024-04-08: qty 90, 90d supply, fill #0

## 2024-04-08 NOTE — Patient Instructions (Addendum)
-   We will have you reapply for the cone discount before it expires.  Continue with your gynecology appointment: Scheduled Wednesday 05/07/24 @ 1:55PM   [facility] [address] [place], [place] [address]  Here is their phone number: [phone]   When you see them, make sure you let them know you have an IUD in place.  ---   Ladora pediremos que vuelva a solicitar el descuento para conos antes de que caduque.  Contine con su cita de ginecologa: Programada el mircoles 22/10/25 a la 1:55 p. m.  [facility], [address], [place], [place] [address]. Aqu est su nmero de telfono: [phone].  Cuando los vea, asegrese de informarles que tiene un DIU.

## 2024-04-08 NOTE — Progress Notes (Signed)
 Patient ID: Michele Wright, female    DOB: Jun 18, 1976  MRN: [id number]  CC: Chronic Disease Management (Follow-up. Med refills. /No questions / concerns/Yes to flu vax)   Subjective: Michele Wright is a 48 y.o. female who presents for chronic ds management. Her concerns today include:  Pt with hx of asthma, HL, HTN, PreDM, fhx of breast CA in sister, irregular menses with fibroid, b/l knee osteoarthritis  [employer] Language interpreter used during this encounter. #[id number] Michele Wright   Asthma: she has not had any major flareups. Continues to use Breo and Singulair .  Irregular menses, uterine fibroid with IUD in place: Last visit complained about irregular menses, and  has IUD in place that was present for 3 years. Workup revealed uterine fibroid. She has not yet seen gynecology in f/u. States she did not get call from gynecologist to make the appointment yet. Has applied and been approved for the [employer] discount.  HL: Continues atorvastatin  10 mg daily. LDL level of 106 mg/dL in 02/7973  Obesity:    Diet: Does not consume sugary drinks. Has still been reducing bread and tortilla intake. Exercise: She exercises three to four times a week, primarily cycling for 30 to 40 minutes per session. Limited by knee pain, but thinks she could increase. Had injection to knee recently in 12/2023 for knee arthritis with Dr. Addie, and may need knee replacement in the future.  Wt Readings from Last 3 Encounters:  04/08/24 179 lb (81.2 kg)  12/04/23 179 lb (81.2 kg)  07/31/23 189 lb (85.7 kg)    HM Influenza Vaccine due on 02/15/2024 Wanted shingles but is not yet 48, no insurance coverage    Patient Active Problem List   Diagnosis Date Noted   Primary osteoarthritis of both knees 11/10/2022   Chronic pain of both knees 01/09/2018   Right medial knee pain 11/11/2015   Peripheral edema 11/11/2015   Heel pain, bilateral 01/14/2014   Asthma, chronic 04/03/2013     Current Outpatient Medications on  File Prior to Visit  Medication Sig Dispense Refill   albuterol  (VENTOLIN  HFA) 108 (90 Base) MCG/ACT inhaler Inhale 2 puffs into the lungs every 4 (four) hours as needed for wheezing. 6.7 g 6   montelukast  (SINGULAIR ) 10 MG tablet Take 1 tablet (10 mg total) by mouth at bedtime. 30 tablet 11   atorvastatin  (LIPITOR) 10 MG tablet Take 1 tablet (10 mg total) by mouth daily. 90 tablet 3   fluticasone  (FLONASE ) 50 MCG/ACT nasal spray Place 2 sprays into both nostrils daily. (Patient not taking: Reported on 04/08/2024) 16 g 6   fluticasone  furoate-vilanterol (BREO ELLIPTA ) 200-25 MCG/ACT AEPB Inhale 1 puff into the lungs daily. (Patient not taking: Reported on 04/08/2024) 60 each 6   loratadine  (CLARITIN ) 10 MG tablet Take 1 tablet (10 mg total) by mouth daily. 30 tablet 11   No current facility-administered medications on file prior to visit.    No Known Allergies  Social History   Socioeconomic History   Marital status: Married    Spouse name: Not on file   Number of children: Not on file   Years of education: Not on file   Highest education level: Not on file  Occupational History   Not on file  Tobacco Use   Smoking status: Never   Smokeless tobacco: Never  Vaping Use   Vaping status: Never Used  Substance and Sexual Activity   Alcohol use: Yes    Comment: occasionally   Drug use: No  Sexual activity: Not on file  Other Topics Concern   Not on file  Social History Narrative   Not on file   Social Drivers of Health   Financial Resource Strain: Low Risk  (07/31/2023)   Overall Financial Resource Strain (CARDIA)    Difficulty of Paying Living Expenses: Not very hard  Food Insecurity: No Food Insecurity (07/31/2023)   Hunger Vital Sign    Worried About Running Out of Food in the Last Year: Never true    Ran Out of Food in the Last Year: Never true  Transportation Needs: No Transportation Needs (07/31/2023)   PRAPARE - Administrator, Civil Service (Medical): No     Lack of Transportation (Non-Medical): No  Physical Activity: Inactive (07/31/2023)   Exercise Vital Sign    Days of Exercise per Week: 0 days    Minutes of Exercise per Session: 0 min  Stress: No Stress Concern Present (07/31/2023)   Michele Wright of Occupational Health - Occupational Stress Questionnaire    Feeling of Stress : Not at all  Social Connections: Socially Integrated (07/31/2023)   Social Connection and Isolation Panel    Frequency of Communication with Friends and Family: More than three times a week    Frequency of Social Gatherings with Friends and Family: Once a week    Attends Religious Services: More than 4 times per year    Active Member of [employer] or Organizations: Yes    Attends Banker Meetings: 1 to 4 times per year    Marital Status: Married  Catering manager Violence: Not At Risk (07/31/2023)   Humiliation, Afraid, Rape, and Kick questionnaire    Fear of Current or Ex-Partner: No    Emotionally Abused: No    Physically Abused: No    Sexually Abused: No    Family History  Problem Relation Age of Onset   Breast cancer Sister    Breast cancer Paternal Aunt     Past Surgical History:  Procedure Laterality Date   NO PAST SURGERIES      ROS: Review of Systems Negative except as stated above  PHYSICAL EXAM: BP 127/77 (BP Location: Left Arm, Patient Position: Sitting, Cuff Size: Normal)   Pulse (!) 53   Ht 5' 1 (1.549 m)   Wt 179 lb (81.2 kg)   SpO2 97%   BMI 33.82 kg/m   Physical Exam  General appearance - alert, well appearing, middle aged Hispanic female, mildly obese in no distress Mental status - normal mood, behavior, speech, dress, motor activity, and thought processes Chest - clear to auscultation, no wheezes, rales or rhonchi, symmetric air entry Heart - normal rate, regular rhythm, normal S1, S2, no murmurs, rubs, clicks or gallops Extremities - no pedal edema noted      Latest Ref Rng & Units 07/31/2023    9:25 AM  03/24/2022   10:39 AM 06/04/2019    8:42 AM  CMP  Glucose 70 - 99 mg/dL 95  894  888   BUN 6 - 24 mg/dL 11  13  13    Creatinine 0.57 - 1.00 mg/dL 9.45  9.33  9.36   Sodium 134 - 144 mmol/L 140  138  140   Potassium 3.5 - 5.2 mmol/L 4.5  4.6  4.5   Chloride 96 - 106 mmol/L 103  100  104   CO2 20 - 29 mmol/L 21  23  22    Calcium  8.7 - 10.2 mg/dL 8.9  9.5  9.3  Total Protein 6.0 - 8.5 g/dL 6.9  7.5  6.8   Total Bilirubin 0.0 - 1.2 mg/dL 0.2  0.3  <9.7   Alkaline Phos 44 - 121 IU/L 87  96  98   AST 0 - 40 IU/L 19  14  20    ALT 0 - 32 IU/L 18  19  27     Lipid Panel     Component Value Date/Time   CHOL 188 07/31/2023 0925   TRIG 127 07/31/2023 0925   HDL 60 07/31/2023 0925   CHOLHDL 3.1 07/31/2023 0925   CHOLHDL 3.4 04/23/2014 1013   VLDL 22 04/23/2014 1013   LDLCALC 106 (H) 07/31/2023 0925    CBC    Component Value Date/Time   WBC 6.9 12/04/2023 0944   WBC 8.6 04/23/2014 1013   RBC 4.49 12/04/2023 0944   RBC 4.82 04/23/2014 1013   HGB 12.7 12/04/2023 0944   HCT 39.8 12/04/2023 0944   PLT 262 12/04/2023 0944   MCV 89 12/04/2023 0944   MCH 28.3 12/04/2023 0944   MCH 28.4 04/23/2014 1013   MCHC 31.9 12/04/2023 0944   MCHC 33.8 04/23/2014 1013   RDW 12.5 12/04/2023 0944   LYMPHSABS 2.2 03/24/2022 1039   MONOABS 0.6 04/23/2014 1013   EOSABS 0.5 (H) 03/24/2022 1039   BASOSABS 0.1 03/24/2022 1039    ASSESSMENT AND PLAN:  Assessment and Plan  1. Class 1 obesity due to excess calories with serious comorbidity and body mass index (BMI) of 33.0 to 33.9 in adult (Primary) Weight stable. Encouraged her to continue healthy eating habits and regular exercise.   2. Elevated LDL cholesterol level Continue atorvastatin .  - atorvastatin  (LIPITOR) 10 MG tablet; Take 1 tablet (10 mg total) by mouth daily.  Dispense: 90 tablet; Refill: 3  3. Irregular menses 4. Fibroid Reached out to scheduling coordinator, patient has been scheduled to see gynecologist at [facility]  in [place] on 05/07/2024 for further evaluation of her irregular menses, likely caused by a uterine fibroid. Told her to let them know she has an IUD in place >3 years.  5. Mild persistent asthma without complication Well-managed on Breo and Singulair .  - loratadine  (CLARITIN ) 10 MG tablet; Take 1 tablet (10 mg total) by mouth daily.  Dispense: 30 tablet; Refill: 1  6. Need for immunization against influenza - Flu vaccine trivalent PF, 6mos and older(Flulaval,Afluria,Fluarix,Fluzone)  Patient was given the opportunity to ask questions.  Patient verbalized understanding of the plan and was able to repeat key elements of the plan.   No orders of the defined types were placed in this encounter.    Requested Prescriptions   Pending Prescriptions Disp Refills   atorvastatin  (LIPITOR) 10 MG tablet 90 tablet 3    Sig: Take 1 tablet (10 mg total) by mouth daily.   loratadine  (CLARITIN ) 10 MG tablet 30 tablet 1    Sig: Take 1 tablet (10 mg total) by mouth daily.    No follow-ups on file.  This is a Psychologist, occupational Note.  The care of the patient was discussed with Dr. Vicci and the assessment and plan formulated with her assistance.  Please see her attestation of this encounter.  Duwaine Amber, MS3 [facility]  Evaluation and management procedures were performed by me with Medical Student in attendance, note written by Medical Student under my supervision and collaboration. I have reviewed the note and I agree with the management and plan.   Barnie Vicci, MD, FAAFP. [facility]  and [facility]ness [place], [place] [phone]   04/08/2024, 1:38 PM

## 2024-04-17 ENCOUNTER — Other Ambulatory Visit: Payer: Self-pay

## 2024-05-07 ENCOUNTER — Other Ambulatory Visit: Payer: Self-pay

## 2024-05-07 ENCOUNTER — Encounter: Payer: Self-pay | Admitting: Obstetrics and Gynecology

## 2024-05-07 ENCOUNTER — Ambulatory Visit (INDEPENDENT_AMBULATORY_CARE_PROVIDER_SITE_OTHER): Payer: Self-pay | Admitting: Obstetrics and Gynecology

## 2024-05-07 VITALS — BP 123/84 | HR 60 | Wt 175.0 lb

## 2024-05-07 DIAGNOSIS — N939 Abnormal uterine and vaginal bleeding, unspecified: Secondary | ICD-10-CM

## 2024-05-07 DIAGNOSIS — Z603 Acculturation difficulty: Secondary | ICD-10-CM

## 2024-05-07 DIAGNOSIS — Z975 Presence of (intrauterine) contraceptive device: Secondary | ICD-10-CM

## 2024-05-07 DIAGNOSIS — Z758 Other problems related to medical facilities and other health care: Secondary | ICD-10-CM

## 2024-05-07 NOTE — Progress Notes (Unsigned)
 NEW GYNECOLOGY PATIENT Patient name: Michele Wright MRN [id number]  Date of birth: Oct 28, 1975 Chief Complaint:   Abnormal Uterine Bleeding  History:  Michele Wright  here for changes in bleeding with IUD in place. She was having pain and pelvic US  in June noted fibroids and IUD in place. Menses is every 2 months for the last 6 months and no intermenstrual bleeding. Cycles when it does occur is 5 days, moderate flow. Has blood clots since it started spacing out, small clots. Pain has reolved, last occurrence in June. Has not been sexually active since bleeding changes to be sure that everything is ok but typically is sexually active with husband. Pap in 2024, normal.  Paragard IUD in place and previously having monthly cycles.  Having some discharge and itching here and there that has not been too bothersome. White discharge that has been intermittent. No vaginal dryness. Will notice the discharge and then it will resolve on it's own and she assumes it is part of the aging process.      Gynecologic History Patient's last menstrual period was 04/27/2024. Contraception: IUD   OB History  Gravida Para Term Preterm AB Living  2 2 2   2   SAB IAB Ectopic Multiple Live Births      2    # Outcome Date GA Lbr Len/2nd Weight Sex Type Anes PTL Lv  2 Term 1997     Vag-Spont   LIV  1 Term 34     Vag-Spont   LIV     The following portions of the patient's history were reviewed and updated as appropriate: allergies, current medications, past family history, past medical history, past social history, past surgical history and problem list. Health Maintenance  Topic Date Due   Hepatitis C Screening  Never done   Hepatitis B Vaccine (1 of 3 - 19+ 3-dose series) Never done   COVID-19 Vaccine (1 - 2025-26 season) Never done   Colon Cancer Screening  12/04/2024*   Stool Blood Test  12/04/2024   Breast Cancer Screening  02/06/2026   DTaP/Tdap/Td vaccine (2 - Td or Tdap) 05/18/2027   Pap  with HPV screening  11/10/2027   Pneumococcal Vaccine  Completed   Flu Shot  Completed   HIV Screening  Completed   HPV Vaccine  Aged Out   Meningitis B Vaccine  Aged Out  *Topic was postponed. The date shown is not the original due date.     Review of Systems Pertinent items noted in HPI and remainder of comprehensive ROS otherwise negative.  Physical Exam:  BP 123/84   Pulse 60   Wt 175 lb (79.4 kg)   LMP 04/27/2024   BMI 33.07 kg/m  Physical Exam Vitals and nursing note reviewed.  Constitutional:      Appearance: Normal appearance.  Cardiovascular:     Rate and Rhythm: Normal rate.  Pulmonary:     Effort: Pulmonary effort is normal.     Breath sounds: Normal breath sounds.  Neurological:     General: No focal deficit present.     Mental Status: She is alert and oriented to person, place, and time.  Psychiatric:        Mood and Affect: Mood normal.        Behavior: Behavior normal.        Thought Content: Thought content normal.        Judgment: Judgment normal.       Assessment and Plan:  1. IUD (intrauterine device)  in place (Primary) 2. Abnormal uterine bleeding (AUB) Copper IUD in place with new onset oligomenorrhea, likely due to perimenopausal changes given age. Pelvic US  demonstrates IUD is in correct position and fibroids small. Noted likely having some discharge changes as estrogen declines but if persistent or bothersome symptoms accompany the discharge, recommend testing and treatment as needed.   3. Language barrier In person interpreter used for encounter    Follow-up: No follow-ups on file.      Carter Quarry, MD Obstetrician & Gynecologist, Faculty Practice [facility] [employer], [facility]

## 2024-05-19 ENCOUNTER — Other Ambulatory Visit: Payer: Self-pay

## 2024-06-09 ENCOUNTER — Other Ambulatory Visit: Payer: Self-pay

## 2024-08-08 ENCOUNTER — Ambulatory Visit: Payer: Self-pay | Admitting: Internal Medicine

## 2024-08-18 ENCOUNTER — Other Ambulatory Visit: Payer: Self-pay

## 2024-09-16 ENCOUNTER — Ambulatory Visit: Payer: Self-pay | Admitting: Internal Medicine
